# Patient Record
Sex: Female | Born: 1955 | Race: White | Hispanic: No | Marital: Married | Smoking: Never smoker
Health system: Southern US, Community
[De-identification: ages and names within clinical notes are randomized; demographics above are authoritative.]

## PROBLEM LIST (undated history)

## (undated) DIAGNOSIS — R350 Frequency of micturition: Secondary | ICD-10-CM

## (undated) DIAGNOSIS — R351 Nocturia: Secondary | ICD-10-CM

## (undated) DIAGNOSIS — R110 Nausea: Secondary | ICD-10-CM

## (undated) DIAGNOSIS — N201 Calculus of ureter: Secondary | ICD-10-CM

## (undated) DIAGNOSIS — R3915 Urgency of urination: Secondary | ICD-10-CM

## (undated) DIAGNOSIS — R51 Headache: Secondary | ICD-10-CM

## (undated) DIAGNOSIS — S82002A Unspecified fracture of left patella, initial encounter for closed fracture: Secondary | ICD-10-CM

---

## 1998-03-31 ENCOUNTER — Other Ambulatory Visit: Admission: RE | Admit: 1998-03-31 | Discharge: 1998-03-31 | Payer: Self-pay | Admitting: Obstetrics and Gynecology

## 1999-06-26 ENCOUNTER — Other Ambulatory Visit: Admission: RE | Admit: 1999-06-26 | Discharge: 1999-06-26 | Payer: Self-pay | Admitting: Obstetrics and Gynecology

## 2000-08-08 ENCOUNTER — Other Ambulatory Visit: Admission: RE | Admit: 2000-08-08 | Discharge: 2000-08-08 | Payer: Self-pay | Admitting: Obstetrics and Gynecology

## 2001-11-16 ENCOUNTER — Other Ambulatory Visit: Admission: RE | Admit: 2001-11-16 | Discharge: 2001-11-16 | Payer: Self-pay | Admitting: Obstetrics and Gynecology

## 2003-02-09 ENCOUNTER — Other Ambulatory Visit: Admission: RE | Admit: 2003-02-09 | Discharge: 2003-02-09 | Payer: Self-pay | Admitting: Obstetrics and Gynecology

## 2003-05-30 ENCOUNTER — Other Ambulatory Visit: Admission: RE | Admit: 2003-05-30 | Discharge: 2003-05-30 | Payer: Self-pay | Admitting: Obstetrics and Gynecology

## 2003-11-22 ENCOUNTER — Other Ambulatory Visit: Admission: RE | Admit: 2003-11-22 | Discharge: 2003-11-22 | Payer: Self-pay | Admitting: Obstetrics and Gynecology

## 2005-02-19 ENCOUNTER — Other Ambulatory Visit: Admission: RE | Admit: 2005-02-19 | Discharge: 2005-02-19 | Payer: Self-pay | Admitting: Obstetrics and Gynecology

## 2007-06-26 ENCOUNTER — Emergency Department (HOSPITAL_COMMUNITY): Admission: EM | Admit: 2007-06-26 | Discharge: 2007-06-26 | Payer: Self-pay | Admitting: Emergency Medicine

## 2011-01-14 LAB — DIFFERENTIAL
Eosinophils Absolute: 0
Lymphs Abs: 2.8
Neutrophils Relative %: 66

## 2011-01-14 LAB — I-STAT 8, (EC8 V) (CONVERTED LAB)
Acid-Base Excess: 1
Bicarbonate: 25.2 — ABNORMAL HIGH
Chloride: 107
HCT: 40
Operator id: 285841
pCO2, Ven: 37.7 — ABNORMAL LOW

## 2011-01-14 LAB — CBC
MCV: 92
Platelets: 299
WBC: 10

## 2011-01-14 LAB — POCT I-STAT CREATININE: Operator id: 285841

## 2011-01-14 LAB — POCT CARDIAC MARKERS
CKMB, poc: <1 — ABNORMAL LOW
Troponin i, poc: <0.05

## 2011-01-14 LAB — D-DIMER, QUANTITATIVE: D-Dimer, Quant: <0.22

## 2011-04-23 HISTORY — PX: OTHER SURGICAL HISTORY: SHX169

## 2011-07-08 ENCOUNTER — Encounter (HOSPITAL_COMMUNITY): Payer: Self-pay | Admitting: Emergency Medicine

## 2011-07-08 ENCOUNTER — Emergency Department (HOSPITAL_COMMUNITY)
Admission: EM | Admit: 2011-07-08 | Discharge: 2011-07-08 | Disposition: A | Discharge status: Home / Self Care | Payer: Worker's Compensation | Attending: Emergency Medicine | Admitting: Emergency Medicine

## 2011-07-08 DIAGNOSIS — S52539A Colles' fracture of unspecified radius, initial encounter for closed fracture: Secondary | ICD-10-CM | POA: Insufficient documentation

## 2011-07-08 DIAGNOSIS — S52509A Unspecified fracture of the lower end of unspecified radius, initial encounter for closed fracture: Secondary | ICD-10-CM

## 2011-07-08 DIAGNOSIS — M25539 Pain in unspecified wrist: Secondary | ICD-10-CM | POA: Insufficient documentation

## 2011-07-08 DIAGNOSIS — M7989 Other specified soft tissue disorders: Secondary | ICD-10-CM | POA: Insufficient documentation

## 2011-07-08 MED ORDER — HYDROCODONE-ACETAMINOPHEN 5-325 MG PO TABS: 1.0000 | ORAL_TABLET | ORAL | Status: AC | PRN

## 2011-07-08 NOTE — Discharge Instructions (Signed)
CONTINUE TO ICE AND ELEVATE THE WRIST TO REDUCE SWELLING. TAKE IBUPROFEN REGULARLY AND TAKE NORCO AS NEEDED FOR PAIN AND INFLAMMATION. FOLLOW UP WITH DR. Mina Marble FOR FURTHER TREATMENT BY CALLING HIS OFFICE IN THE MORNING TO SCHEDULE A TIME TO BE SEEN IN THE NEXT 2-3 DAYS.  Cast or Splint Care Casts and splints support injured limbs and keep bones from moving while they heal.  HOME CARE  Keep the cast or splint uncovered during the drying period.   A plaster cast can take 24 to 48 hours to dry.   A fiberglass cast will dry in less than 1 hour.   Do not rest the cast on anything harder than a pillow for 24 hours.   Do not put weight on your injured limb. Do not put pressure on the cast. Wait for your doctor's approval.   Keep the cast or splint dry.   Cover the cast or splint with a plastic bag during baths or wet weather.   If you have a cast over your chest and belly (trunk), take sponge baths until the cast is taken off.   Keep your cast or splint clean. Wash a dirty cast with a damp cloth.   Do not put any objects under your cast or splint. Do not scratch the skin under the cast with an object.   Do not take out the padding from inside your cast.   Exercise your joints near the cast as told by your doctor.   Raise (elevate) your injured limb on 1 or 2 pillows for the first 1 to 3 days.  GET HELP RIGHT AWAY IF:  Your cast or splint cracks.   Your cast or splint is too tight or too loose.   You itch badly under the cast.   Your cast gets wet or has a soft spot.   You have a bad smell coming from the cast.   You get an object stuck under the cast.   Your skin around the cast becomes red or raw.   You have new or more pain after the cast is put on.   You have fluid leaking through the cast.   You cannot move your fingers or toes.   Your fingers or toes turn colors or are cool, painful, or puffy (swollen).   You have tingling or lose feeling (numbness) around the  injured area.   You have pain or pressure under the cast.   You have trouble breathing or have shortness of breath.   You have chest pain.  MAKE SURE YOU:  Understand these instructions.   Will watch your condition.   Will get help right away if you are not doing well or get worse.  Document Released: 08/08/2010 Document Revised: 03/28/2011 Document Reviewed: 08/08/2010 [facility] Patient Information 2012 [place], [place].  Wrist Fracture Your caregiver has diagnosed you as having a fracture of the wrist. A fracture is a break in the bone or bones. A cast or splint is used to protect and keep your injured bone(s) from moving. The cast or splint will usually be on for about 5 to 6 weeks. One of the bones of the wrist (the navicular bone) often does not show up as a fracture on X-ray until later or in the healing phase. With this bone your caregiver will often cast as though it is fractured even if not seen on the X-ray. HOME CARE INSTRUCTIONS   To lessen the swelling, keep the injured part elevated while sitting  or lying down. Keeping the injury above the level of your heart (the center of the chest) will decrease swelling and pain.   Do not wear rings or jewelry on the injured hand or wrist.   Apply ice to the injury for 15 to 20 minutes, 3 to 4 times per day while awake for 2 days. Put the ice in a plastic bag and place a thin towel between the bag of ice and your cast.   If you have a plaster or fiberglass cast:   Do not try to scratch the skin under the cast using sharp or pointed objects.   Check the skin around the cast every day. You may put lotion on any red or sore areas.   Keep your cast dry and clean.   If you have a plaster splint:   Wear the splint as directed.   You may loosen the elastic around the splint if your fingers become numb, tingle, or turn cold or blue.   If you have been put in a removable splint, wear and use as directed.   Do not use powders or  deodorants in or around the cast or splint.   Do not remove padding from your cast or splint.   Do not put pressure on any part of your cast or splint. It may break. Rest your cast or splint only on a pillow the first 24 hours until it is fully hardened.   Gently move your fingers often, so they do not get stiff.   Do not remove the splint unless directed by your caregiver. Casts must be removed by an orthopedist.   Your cast or splint can be protected during bathing with a plastic bag. Do not lower the cast or splint into water.   Only take over-the-counter or prescription medicines for pain, discomfort, or fever as directed by your caregiver.   Follow up with your caregiver as directed.  SEEK IMMEDIATE MEDICAL CARE IF:   Your cast or splint gets damaged or breaks.   Your cast or splint feels too tight or loose.   You have increased pain, not controlled with medication.   You have increased swelling.   Your skin or nails below the injury turn blue or grey or feel cold or numb.   You have trouble moving or feeling your fingers.   You experience any burning or stinging from the cast or splint.   There is a bad smell coming from under the cast or splint.   New stains or fluids are coming from under the cast or splint.   You have any new injuries while wearing the cast or splint.  Document Released: 01/16/2005 Document Revised: 03/28/2011 Document Reviewed: 11/05/2006 [facility] Patient Information 2012 [place], [place].

## 2011-07-08 NOTE — ED Notes (Signed)
Pt states she was roller skating today with special needs kids and fell injuring her left wrist  Pt states she went to urgent care and had xrays, pt brought disc in with her, and it shows a fx  Pt states she has taken 4 ibuporfen prior to arrival

## 2011-07-08 NOTE — ED Provider Notes (Addendum)
History     CSN: [id number]  Arrival date & time 07/08/11  2130   First MD Initiated Contact with Patient 07/08/11 2125      No chief complaint on file.   (Consider location/radiation/quality/duration/timing/severity/associated sxs/prior treatment) Patient is a 56 y.o. female presenting with wrist pain. The history is provided by the patient.  Wrist Pain This is a new problem. The current episode started today. The problem occurs constantly. The problem has been unchanged. Pertinent negatives include no chills or fever. Associated symptoms comments: She was roller skating with a group of children and fell onto flexed left hand causing injury to wrist.. The treatment provided moderate relief.    History reviewed. No pertinent past medical history.  History reviewed. No pertinent past surgical history.  Family History  Problem Relation Age of Onset  . Cancer Other     History  Substance Use Topics  . Smoking status: Never Smoker   . Smokeless tobacco: Not on file  . Alcohol Use: No    OB History    Grav Para Term Preterm Abortions TAB SAB Ect Mult Living                  Review of Systems  Constitutional: Negative for fever and chills.  HENT: Negative.   Respiratory: Negative.   Cardiovascular: Negative.   Gastrointestinal: Negative.   Musculoskeletal:       See HPI.  Skin: Negative.   Neurological: Negative.     Allergies  Review of patient's allergies indicates no known allergies.  Home Medications   Current Outpatient Rx  Name Route Sig Dispense Refill  . DESVENLAFAXINE SUCCINATE ER 50 MG PO TB24 Oral Take 50 mg by mouth daily.    Marland Kitchen ZOLPIDEM TARTRATE 5 MG PO TABS Oral Take 5 mg by mouth at bedtime as needed.      BP 145/107  Pulse 59  Temp(Src) 98.7 F (37.1 C) (Oral)  Resp 18  SpO2 100%  Physical Exam  Constitutional: She is oriented to person, place, and time. She appears well-developed and well-nourished.  Neck: Normal range of motion.    Pulmonary/Chest: Effort normal.  Musculoskeletal:       Left wrist swollen dorsally where is a mild deformity. Pulses present and distal neurovascular exam intact. FROM all digits.   Neurological: She is alert and oriented to person, place, and time.  Skin: Skin is warm and dry.    ED Course  Procedures (including critical care time) No results found. Patient brought disc of wrist x=ray done at Urgent Care prior to arrival showing distal left radius fracture, impacted, with mild 20 degree angulation of distal fragment in volar direction. Patient was placed in a sugar-tong splint by ortho tech and instructed to see Dr. Mina Marble in the next 2-3 days. Also instructed to ice and elevate. Given Norco for pain. Labs Reviewed - No data to display No results found.   No diagnosis found. 1. Impacted distal radial fracture, left.   MDM  No neurovascular compromise. Fracture is not displaced with mild angulation. X-ray reviewed with Dr. Lynelle Doctor. Felt appropriate for splinting and hand surgeon follow up this week.         Rodena Medin, PA-C 07/08/11 2202  Rodena Medin, PA-C 07/09/11 [phone]

## 2011-07-09 NOTE — ED Provider Notes (Signed)
xrays reviewed with me  Medical screening examination/treatment/procedure(s) were performed by non-physician practitioner and as supervising physician I was immediately available for consultation/collaboration. Devoria Albe, MD, FACEP   Ward Givens, MD 07/09/11 [phone]

## 2011-07-09 NOTE — ED Provider Notes (Signed)
See prior note   Ward Givens, MD 07/09/11 [phone]

## 2012-08-27 ENCOUNTER — Other Ambulatory Visit: Payer: Self-pay | Admitting: Obstetrics and Gynecology

## 2012-08-31 ENCOUNTER — Encounter (HOSPITAL_COMMUNITY): Payer: Self-pay | Admitting: Pharmacist

## 2012-09-01 ENCOUNTER — Encounter (HOSPITAL_COMMUNITY): Payer: Self-pay

## 2012-09-01 ENCOUNTER — Encounter (HOSPITAL_COMMUNITY)
Admission: RE | Admit: 2012-09-01 | Discharge: 2012-09-01 | Disposition: A | Discharge status: Home / Self Care | Payer: BC Managed Care – PPO | Source: Ambulatory Visit | Attending: Obstetrics and Gynecology | Admitting: Obstetrics and Gynecology

## 2012-09-01 DIAGNOSIS — Z01812 Encounter for preprocedural laboratory examination: Secondary | ICD-10-CM | POA: Insufficient documentation

## 2012-09-01 DIAGNOSIS — Z01818 Encounter for other preprocedural examination: Secondary | ICD-10-CM | POA: Insufficient documentation

## 2012-09-01 LAB — COMPREHENSIVE METABOLIC PANEL
ALT: 16 U/L (ref 0–35)
Alkaline Phosphatase: 98 U/L (ref 39–117)
CO2: 31 mEq/L (ref 19–32)
GFR calc Af Amer: 89 mL/min — ABNORMAL LOW (ref >90)
GFR calc non Af Amer: 77 mL/min — ABNORMAL LOW (ref >90)
Glucose, Bld: 92 mg/dL (ref 70–99)
Potassium: 4.4 mEq/L (ref 3.5–5.1)
Sodium: 137 mEq/L (ref 135–145)

## 2012-09-01 LAB — CBC
Hemoglobin: 11.8 g/dL — ABNORMAL LOW (ref 12.0–15.0)
RBC: 3.98 MIL/uL (ref 3.87–5.11)
WBC: 8.6 10*3/uL (ref 4.0–10.5)

## 2012-09-01 NOTE — Patient Instructions (Addendum)
20 Mindy Schmitt  09/01/2012   Your procedure is scheduled on:  09/07/12  Enter through the Main Entrance of [facility] at 1030 AM.  Pick up the phone at the desk and dial [phone].   Call this number if you have problems the morning of surgery: [phone]   Remember:   Do not eat food:After Midnight.  Do not drink clear liquids: 4 Hours before arrival.  Take these medicines the morning of surgery with A SIP OF WATER: NA   Do not wear jewelry, make-up or nail polish.  Do not wear lotions, powders, or perfumes. You may wear deodorant.  Do not shave 48 hours prior to surgery.  Do not bring valuables to the hospital.  Contacts, dentures or bridgework may not be worn into surgery.  Leave suitcase in the car. After surgery it may be brought to your room.  For patients admitted to the hospital, checkout time is 11:00 AM the day of discharge.   Patients discharged the day of surgery will not be allowed to drive home.  Name and phone number of your driver: NA  Special Instructions: Shower using CHG 2 nights before surgery and the night before surgery.  If you shower the day of surgery use CHG.  Use special wash - you have one bottle of CHG for all showers.  You should use approximately 1/3 of the bottle for each shower.   Please read over the following fact sheets that you were given: MRSA Information

## 2012-09-07 ENCOUNTER — Observation Stay (HOSPITAL_COMMUNITY)
Admission: RE | Admit: 2012-09-07 | Discharge: 2012-09-09 | Disposition: A | Discharge status: Home / Self Care | Payer: BC Managed Care – PPO | Source: Ambulatory Visit | Attending: Obstetrics and Gynecology | Admitting: Obstetrics and Gynecology

## 2012-09-07 ENCOUNTER — Encounter (HOSPITAL_COMMUNITY): Payer: Self-pay | Admitting: *Deleted

## 2012-09-07 ENCOUNTER — Encounter (HOSPITAL_COMMUNITY): Payer: Self-pay | Admitting: Anesthesiology

## 2012-09-07 ENCOUNTER — Encounter (HOSPITAL_COMMUNITY)
Admission: RE | Disposition: A | Discharge status: Home / Self Care | Payer: Self-pay | Source: Ambulatory Visit | Attending: Obstetrics and Gynecology

## 2012-09-07 ENCOUNTER — Ambulatory Visit (HOSPITAL_COMMUNITY): Payer: BC Managed Care – PPO | Admitting: Anesthesiology

## 2012-09-07 DIAGNOSIS — N8000 Endometriosis of the uterus, unspecified: Secondary | ICD-10-CM | POA: Insufficient documentation

## 2012-09-07 DIAGNOSIS — N812 Incomplete uterovaginal prolapse: Principal | ICD-10-CM | POA: Insufficient documentation

## 2012-09-07 DIAGNOSIS — N393 Stress incontinence (female) (male): Secondary | ICD-10-CM | POA: Insufficient documentation

## 2012-09-07 HISTORY — PX: BLADDER SUSPENSION: SHX72

## 2012-09-07 HISTORY — PX: ANTERIOR AND POSTERIOR REPAIR: SHX5121

## 2012-09-07 HISTORY — PX: VAGINAL HYSTERECTOMY: SHX2639

## 2012-09-07 SURGERY — HYSTERECTOMY, VAGINAL
Anesthesia: General | Site: Vagina | Wound class: Clean Contaminated

## 2012-09-07 MED ORDER — LIDOCAINE-EPINEPHRINE 1 %-1:100000 IJ SOLN
INTRAMUSCULAR | Status: DC | PRN
  Administered 2012-09-07: 60 mL

## 2012-09-07 MED ORDER — MIDAZOLAM HCL 2 MG/2ML IJ SOLN
INTRAMUSCULAR | Status: AC
  Filled 2012-09-07: qty 2

## 2012-09-07 MED ORDER — ONDANSETRON HCL 4 MG PO TABS: 4.0000 mg | ORAL_TABLET | Freq: Four times a day (QID) | ORAL | Status: DC | PRN

## 2012-09-07 MED ORDER — ONDANSETRON HCL 4 MG/2ML IJ SOLN
4.0000 mg | Freq: Four times a day (QID) | INTRAMUSCULAR | Status: DC | PRN
  Administered 2012-09-07: 4 mg via INTRAVENOUS

## 2012-09-07 MED ORDER — DEXTROSE IN LACTATED RINGERS 5 % IV SOLN
INTRAVENOUS | Status: DC
  Administered 2012-09-07 – 2012-09-09 (×3): via INTRAVENOUS

## 2012-09-07 MED ORDER — DIPHENHYDRAMINE HCL 12.5 MG/5ML PO ELIX: 12.5000 mg | ORAL_SOLUTION | Freq: Four times a day (QID) | ORAL | Status: DC | PRN

## 2012-09-07 MED ORDER — EPHEDRINE SULFATE 50 MG/ML IJ SOLN
INTRAMUSCULAR | Status: DC | PRN
  Administered 2012-09-07 (×2): 10 mg via INTRAVENOUS

## 2012-09-07 MED ORDER — HYDROMORPHONE 0.3 MG/ML IV SOLN
INTRAVENOUS | Status: DC
  Administered 2012-09-07: 4 mL via INTRAVENOUS
  Administered 2012-09-07: 3.3 mg via INTRAVENOUS
  Administered 2012-09-08 (×2): 1.2 mg via INTRAVENOUS
  Administered 2012-09-08: 07:00:00 via INTRAVENOUS
  Filled 2012-09-07 (×2): qty 25

## 2012-09-07 MED ORDER — NALOXONE HCL 0.4 MG/ML IJ SOLN: 0.4000 mg | INTRAMUSCULAR | Status: DC | PRN

## 2012-09-07 MED ORDER — SODIUM CHLORIDE 0.9 % IJ SOLN: 9.0000 mL | INTRAMUSCULAR | Status: DC | PRN

## 2012-09-07 MED ORDER — SIMETHICONE 80 MG PO CHEW: 80.0000 mg | CHEWABLE_TABLET | Freq: Four times a day (QID) | ORAL | Status: DC | PRN

## 2012-09-07 MED ORDER — DEXAMETHASONE SODIUM PHOSPHATE 10 MG/ML IJ SOLN
INTRAMUSCULAR | Status: AC
  Filled 2012-09-07: qty 1

## 2012-09-07 MED ORDER — FENTANYL CITRATE 0.05 MG/ML IJ SOLN
INTRAMUSCULAR | Status: AC
  Filled 2012-09-07: qty 2

## 2012-09-07 MED ORDER — ROCURONIUM BROMIDE 50 MG/5ML IV SOLN
INTRAVENOUS | Status: AC
  Filled 2012-09-07: qty 1

## 2012-09-07 MED ORDER — OXYCODONE-ACETAMINOPHEN 5-325 MG PO TABS
1.0000 | ORAL_TABLET | ORAL | Status: DC | PRN
  Administered 2012-09-08: 2 via ORAL
  Administered 2012-09-08: 1 via ORAL
  Administered 2012-09-08: 2 via ORAL
  Filled 2012-09-07 (×2): qty 2
  Filled 2012-09-07: qty 1

## 2012-09-07 MED ORDER — INDIGOTINDISULFONATE SODIUM 8 MG/ML IJ SOLN
INTRAMUSCULAR | Status: DC | PRN
  Administered 2012-09-07: 40 mg via INTRAVENOUS

## 2012-09-07 MED ORDER — LACTATED RINGERS IV SOLN
INTRAVENOUS | Status: DC
  Administered 2012-09-07 (×2): via INTRAVENOUS

## 2012-09-07 MED ORDER — GLYCOPYRROLATE 0.2 MG/ML IJ SOLN
INTRAMUSCULAR | Status: DC | PRN
  Administered 2012-09-07: 0.6 mg via INTRAVENOUS

## 2012-09-07 MED ORDER — LIDOCAINE-EPINEPHRINE 1 %-1:100000 IJ SOLN
INTRAMUSCULAR | Status: DC | PRN
  Administered 2012-09-07: 20 mL

## 2012-09-07 MED ORDER — NEOSTIGMINE METHYLSULFATE 1 MG/ML IJ SOLN
INTRAMUSCULAR | Status: AC
  Filled 2012-09-07: qty 1

## 2012-09-07 MED ORDER — FENTANYL CITRATE 0.05 MG/ML IJ SOLN
INTRAMUSCULAR | Status: AC
  Filled 2012-09-07: qty 5

## 2012-09-07 MED ORDER — HYDROMORPHONE HCL PF 1 MG/ML IJ SOLN: 0.2500 mg | INTRAMUSCULAR | Status: DC | PRN

## 2012-09-07 MED ORDER — GLYCOPYRROLATE 0.2 MG/ML IJ SOLN
INTRAMUSCULAR | Status: AC
  Filled 2012-09-07: qty 3

## 2012-09-07 MED ORDER — STERILE WATER FOR IRRIGATION IR SOLN
Status: DC | PRN
  Administered 2012-09-07: 1000 mL

## 2012-09-07 MED ORDER — LIDOCAINE HCL (CARDIAC) 20 MG/ML IV SOLN
INTRAVENOUS | Status: AC
  Filled 2012-09-07: qty 5

## 2012-09-07 MED ORDER — ONDANSETRON HCL 4 MG/2ML IJ SOLN
4.0000 mg | Freq: Four times a day (QID) | INTRAMUSCULAR | Status: DC | PRN
  Administered 2012-09-08 (×2): 4 mg via INTRAVENOUS
  Filled 2012-09-07 (×3): qty 2

## 2012-09-07 MED ORDER — CEFAZOLIN SODIUM-DEXTROSE 2-3 GM-% IV SOLR
INTRAVENOUS | Status: AC
  Filled 2012-09-07: qty 50

## 2012-09-07 MED ORDER — DEXAMETHASONE SODIUM PHOSPHATE 4 MG/ML IJ SOLN
INTRAMUSCULAR | Status: DC | PRN
  Administered 2012-09-07: 10 mg via INTRAVENOUS

## 2012-09-07 MED ORDER — ACETAMINOPHEN 10 MG/ML IV SOLN
INTRAVENOUS | Status: AC
  Filled 2012-09-07: qty 100

## 2012-09-07 MED ORDER — ONDANSETRON HCL 4 MG/2ML IJ SOLN
INTRAMUSCULAR | Status: DC | PRN
  Administered 2012-09-07: 4 mg via INTRAVENOUS

## 2012-09-07 MED ORDER — DOCUSATE SODIUM 100 MG PO CAPS
100.0000 mg | ORAL_CAPSULE | Freq: Two times a day (BID) | ORAL | Status: DC
  Administered 2012-09-07 – 2012-09-09 (×4): 100 mg via ORAL
  Filled 2012-09-07 (×4): qty 1

## 2012-09-07 MED ORDER — PROPOFOL 10 MG/ML IV BOLUS
INTRAVENOUS | Status: DC | PRN
  Administered 2012-09-07: 20 ug via INTRAVENOUS
  Administered 2012-09-07: 160 ug via INTRAVENOUS

## 2012-09-07 MED ORDER — CEFAZOLIN SODIUM-DEXTROSE 2-3 GM-% IV SOLR
2.0000 g | INTRAVENOUS | Status: AC
  Administered 2012-09-07: 2 g via INTRAVENOUS

## 2012-09-07 MED ORDER — FENTANYL CITRATE 0.05 MG/ML IJ SOLN
INTRAMUSCULAR | Status: DC | PRN
  Administered 2012-09-07: 100 ug via INTRAVENOUS
  Administered 2012-09-07 (×2): 50 ug via INTRAVENOUS
  Administered 2012-09-07: 100 ug via INTRAVENOUS
  Administered 2012-09-07: 50 ug via INTRAVENOUS

## 2012-09-07 MED ORDER — SODIUM CHLORIDE 0.9 % IJ SOLN
INTRAMUSCULAR | Status: DC | PRN
  Administered 2012-09-07: 50 mL via INTRAVENOUS

## 2012-09-07 MED ORDER — ROCURONIUM BROMIDE 100 MG/10ML IV SOLN
INTRAVENOUS | Status: DC | PRN
  Administered 2012-09-07: 35 mg via INTRAVENOUS

## 2012-09-07 MED ORDER — HYDROMORPHONE HCL PF 1 MG/ML IJ SOLN
INTRAMUSCULAR | Status: AC
  Administered 2012-09-07: 0.5 mg via INTRAVENOUS
  Filled 2012-09-07: qty 1

## 2012-09-07 MED ORDER — NEOSTIGMINE METHYLSULFATE 1 MG/ML IJ SOLN
INTRAMUSCULAR | Status: DC | PRN
  Administered 2012-09-07: 3 mg via INTRAVENOUS

## 2012-09-07 MED ORDER — LIDOCAINE HCL (CARDIAC) 20 MG/ML IV SOLN
INTRAVENOUS | Status: DC | PRN
  Administered 2012-09-07: 30 mg via INTRAVENOUS

## 2012-09-07 MED ORDER — MIDAZOLAM HCL 5 MG/5ML IJ SOLN
INTRAMUSCULAR | Status: DC | PRN
  Administered 2012-09-07 (×2): 1 mg via INTRAVENOUS

## 2012-09-07 MED ORDER — INDIGOTINDISULFONATE SODIUM 8 MG/ML IJ SOLN
INTRAMUSCULAR | Status: AC
  Filled 2012-09-07: qty 5

## 2012-09-07 MED ORDER — DIPHENHYDRAMINE HCL 50 MG/ML IJ SOLN: 12.5000 mg | Freq: Four times a day (QID) | INTRAMUSCULAR | Status: DC | PRN

## 2012-09-07 MED ORDER — EPHEDRINE 5 MG/ML INJ
INTRAVENOUS | Status: AC
  Filled 2012-09-07: qty 10

## 2012-09-07 MED ORDER — PROPOFOL 10 MG/ML IV EMUL
INTRAVENOUS | Status: AC
  Filled 2012-09-07: qty 20

## 2012-09-07 MED ORDER — ONDANSETRON HCL 4 MG/2ML IJ SOLN
INTRAMUSCULAR | Status: AC
  Filled 2012-09-07: qty 2

## 2012-09-07 MED ORDER — ACETAMINOPHEN 10 MG/ML IV SOLN
INTRAVENOUS | Status: DC | PRN
  Administered 2012-09-07: 1000 mg via INTRAVENOUS

## 2012-09-07 MED ORDER — ESTRADIOL 0.1 MG/GM VA CREA
TOPICAL_CREAM | VAGINAL | Status: AC
  Filled 2012-09-07: qty 42.5

## 2012-09-07 SURGICAL SUPPLY — 46 items
ADH SKN CLS APL DERMABOND .7 (GAUZE/BANDAGES/DRESSINGS) ×2
CANISTER SUCTION 2500CC (MISCELLANEOUS) ×3 IMPLANT
CATH BONANNO SUPRAPUBIC 14G (CATHETERS) IMPLANT
CATH FOLEY 2WAY SLVR  5CC 18FR (CATHETERS) ×1
CATH FOLEY 2WAY SLVR 5CC 18FR (CATHETERS) ×2 IMPLANT
CATH ROBINSON RED A/P 16FR (CATHETERS) ×2 IMPLANT
CLOTH BEACON ORANGE TIMEOUT ST (SAFETY) ×3 IMPLANT
CONT PATH 16OZ SNAP LID 3702 (MISCELLANEOUS) ×2 IMPLANT
CONTAINER PREFILL 10% NBF 60ML (FORM) ×3 IMPLANT
DECANTER SPIKE VIAL GLASS SM (MISCELLANEOUS) ×2 IMPLANT
DERMABOND ADVANCED (GAUZE/BANDAGES/DRESSINGS) ×1
DERMABOND ADVANCED .7 DNX12 (GAUZE/BANDAGES/DRESSINGS) ×2 IMPLANT
DRAPE HYSTEROSCOPY (DRAPE) ×3 IMPLANT
DRESSING TELFA 8X3 (GAUZE/BANDAGES/DRESSINGS) ×3 IMPLANT
GAUZE PACKING 2X5 YD STERILE (GAUZE/BANDAGES/DRESSINGS) IMPLANT
GAUZE PACKING IODOFORM 2 (PACKING) IMPLANT
GLOVE ECLIPSE 7.0 STRL STRAW (GLOVE) ×6 IMPLANT
GOWN PREVENTION PLUS LG XLONG (DISPOSABLE) ×6 IMPLANT
GOWN PREVENTION PLUS XLARGE (GOWN DISPOSABLE) ×3 IMPLANT
GOWN STRL REIN XL XLG (GOWN DISPOSABLE) ×12 IMPLANT
NDL SPNL 18GX3.5 QUINCKE PK (NEEDLE) ×1 IMPLANT
NDL SPNL 22GX3.5 QUINCKE BK (NEEDLE) IMPLANT
NEEDLE HYPO 22GX1.5 SAFETY (NEEDLE) ×2 IMPLANT
NEEDLE SPNL 18GX3.5 QUINCKE PK (NEEDLE) ×3 IMPLANT
NEEDLE SPNL 22GX3.5 QUINCKE BK (NEEDLE) ×3 IMPLANT
NS IRRIG 1000ML POUR BTL (IV SOLUTION) ×3 IMPLANT
PACK VAGINAL WOMENS (CUSTOM PROCEDURE TRAY) ×3 IMPLANT
PAD OB MATERNITY 4.3X12.25 (PERSONAL CARE ITEMS) ×3 IMPLANT
SET CYSTO W/LG BORE CLAMP LF (SET/KITS/TRAYS/PACK) ×3 IMPLANT
SPONGE LAP 4X18 X RAY DECT (DISPOSABLE) ×3 IMPLANT
SUT MNCRL 0 MO-4 VIOLET 18 CR (SUTURE) ×6 IMPLANT
SUT MNCRL 0 VIOLET 6X18 (SUTURE) ×2 IMPLANT
SUT MONOCRYL 0 6X18 (SUTURE) ×1
SUT MONOCRYL 0 MO 4 18  CR/8 (SUTURE) ×3
SUT VIC AB 0 CT1 27 (SUTURE) ×6
SUT VIC AB 0 CT1 27XBRD ANBCTR (SUTURE) ×4 IMPLANT
SUT VIC AB 2-0 CT1 27 (SUTURE) ×21
SUT VIC AB 2-0 CT1 TAPERPNT 27 (SUTURE) ×10 IMPLANT
SUT VIC AB 2-0 UR5 27 (SUTURE) IMPLANT
SUT VIC AB 2-0 UR6 27 (SUTURE) IMPLANT
SUT VICRYL 0 UR6 27IN ABS (SUTURE) IMPLANT
SUT VICRYL RAPIDE 3 0 (SUTURE) IMPLANT
SYR 20CC LL (SYRINGE) ×3 IMPLANT
TOWEL OR 17X24 6PK STRL BLUE (TOWEL DISPOSABLE) ×6 IMPLANT
TRAY FOLEY CATH 14FR (SET/KITS/TRAYS/PACK) ×3 IMPLANT
WATER STERILE IRR 1000ML POUR (IV SOLUTION) ×3 IMPLANT

## 2012-09-07 NOTE — Anesthesia Preprocedure Evaluation (Signed)

## 2012-09-07 NOTE — Transfer of Care (Signed)
Immediate Anesthesia Transfer of Care Note  Patient: Mindy Schmitt  Procedure(s) Performed: Procedure(s): HYSTERECTOMY VAGINAL (N/A) SALPINGO OOPHORECTOMY (Bilateral) ANTERIOR (CYSTOCELE) AND POSTERIOR REPAIR (RECTOCELE) (N/A) TRANSVAGINAL TAPE (TVT) PROCEDURE with CYSTO (N/A)  Patient Location: PACU  Anesthesia Type:General  Level of Consciousness: awake, sedated, pateint uncooperative and responds to stimulation  Airway & Oxygen Therapy: Patient Spontanous Breathing and Patient connected to nasal cannula oxygen  Post-op Assessment: Report given to PACU RN and Post -op Vital signs reviewed and stable  Post vital signs: Reviewed and stable  Complications: No apparent anesthesia complications

## 2012-09-07 NOTE — H&P (Signed)
Pt is a 57 yr old white female who presents to the OR for a TVH poss BSO, TVT, A&P repairs for a several yr h.o. Worsening pelvic prolapse, SUI. Pt c/o worsening pressure and heaviness. Cannot work a full day because of this. PE: VSSAF         HEENT- wnl         CV- rrr without murmur.         ABD- soft, non tender, no masses         Pelvic- ext- atrophic                     Vagina- with cystocele and recotcele.                     cx - parous, no lesion                     Ut- 1st degree prolapse, small                     Adnexa- no masses. IMP/ Symptomatic prolapse          SUI PLAN/ TVH poss BSO, TVT, cysto

## 2012-09-07 NOTE — OR Nursing (Signed)
Pt fell  Last week. Noted small open abrasion on lower rt leg, small abrasion below left knee. Small abrasion on rt hand

## 2012-09-07 NOTE — Anesthesia Postprocedure Evaluation (Signed)
  Anesthesia Post-op Note  Patient: Mindy Schmitt  Procedure(s) Performed: Procedure(s): HYSTERECTOMY VAGINAL (N/A) ANTERIOR (CYSTOCELE) AND POSTERIOR REPAIR (RECTOCELE) (N/A) TRANSVAGINAL TAPE (TVT) PROCEDURE with CYSTO (N/A)  Patient Location: PACU  Anesthesia Type:General  Level of Consciousness: awake, alert  and oriented  Airway and Oxygen Therapy: Patient Spontanous Breathing  Post-op Pain: mild  Post-op Assessment: Post-op Vital signs reviewed, Patient's Cardiovascular Status Stable, Respiratory Function Stable, Patent Airway, No signs of Nausea or vomiting and Pain level controlled  Post-op Vital Signs: Reviewed and stable  Complications: No apparent anesthesia complications

## 2012-09-07 NOTE — Anesthesia Postprocedure Evaluation (Signed)
  Anesthesia Post-op Note  Patient: Mindy Schmitt  Procedure(s) Performed: Procedure(s): HYSTERECTOMY VAGINAL (N/A) ANTERIOR (CYSTOCELE) AND POSTERIOR REPAIR (RECTOCELE) (N/A) TRANSVAGINAL TAPE (TVT) PROCEDURE with CYSTO (N/A)  Patient Location: [facility]  Anesthesia Type:General  Level of Consciousness: awake, alert  and oriented  Airway and Oxygen Therapy: Patient Spontanous Breathing and Patient connected to nasal cannula oxygen  Post-op Pain: none  Post-op Assessment: Post-op Vital signs reviewed and Patient's Cardiovascular Status Stable  Post-op Vital Signs: Reviewed and stable  Complications: No apparent anesthesia complications

## 2012-09-08 ENCOUNTER — Encounter (HOSPITAL_COMMUNITY): Payer: Self-pay | Admitting: Obstetrics and Gynecology

## 2012-09-08 LAB — CBC
MCH: 29.5 pg (ref 26.0–34.0)
Platelets: 262 10*3/uL (ref 150–400)
RBC: 3.56 MIL/uL — ABNORMAL LOW (ref 3.87–5.11)
RDW: 13.2 % (ref 11.5–15.5)

## 2012-09-08 MED ORDER — KETOROLAC TROMETHAMINE 30 MG/ML IJ SOLN
30.0000 mg | Freq: Four times a day (QID) | INTRAMUSCULAR | Status: DC
  Administered 2012-09-08 – 2012-09-09 (×2): 30 mg via INTRAVENOUS
  Filled 2012-09-08 (×2): qty 1

## 2012-09-08 MED ORDER — ZOLPIDEM TARTRATE 5 MG PO TABS
5.0000 mg | ORAL_TABLET | Freq: Every evening | ORAL | Status: DC | PRN
  Administered 2012-09-08: 5 mg via ORAL
  Filled 2012-09-08: qty 1

## 2012-09-08 MED ORDER — DIPHENHYDRAMINE HCL 25 MG PO CAPS
25.0000 mg | ORAL_CAPSULE | ORAL | Status: DC | PRN
  Administered 2012-09-08: 25 mg via ORAL
  Filled 2012-09-08: qty 1

## 2012-09-08 MED ORDER — DEXTROSE 5 % IN LACTATED RINGERS IV BOLUS
500.0000 mL | Freq: Once | INTRAVENOUS | Status: AC
  Administered 2012-09-08: 500 mL via INTRAVENOUS

## 2012-09-08 MED ORDER — HYDROMORPHONE HCL 2 MG PO TABS
2.0000 mg | ORAL_TABLET | ORAL | Status: DC | PRN
  Administered 2012-09-09 (×3): 2 mg via ORAL
  Administered 2012-09-09: 4 mg via ORAL
  Filled 2012-09-08 (×2): qty 1
  Filled 2012-09-08: qty 2
  Filled 2012-09-08: qty 1

## 2012-09-08 NOTE — Progress Notes (Signed)
POD#1 Pt had some nausea this am. No emesis. Ambulating well. Slept poorly. VSSAF HGB- stable ABD- soft, incisions healing well. IMP/ stable PLAN/ Will remove the packing and foley.            Voiding trial.            Increase diet.            Shower.

## 2012-09-08 NOTE — Op Note (Signed)
Mindy Schmitt, Mindy Schmitt              ACCOUNT NO.:  [id number]  MEDICAL RECORD NO.:  [id number]  LOCATION:  9305                          FACILITY:  [facility]  PHYSICIAN:  Malva Limes, M.D.    DATE OF BIRTH:  [birth year]  DATE OF PROCEDURE:  09/07/2012 DATE OF DISCHARGE:                              OPERATIVE REPORT   PREOPERATIVE DIAGNOSIS: 1. Symptomatic uterine prolapse. 2. Cystocele. 3. Rectocele. 4. Stress urine incontinence.  PRINCIPLE PROCEDURES: 1. Total vaginal hysterectomy. 2. Anterior colporrhaphy. 3. Posterior colporrhaphy. 4. TVT urethropexy. 5. Cystoscopy.  SURGEON:  Malva Limes, M.D.  ASSISTANT:  Luvenia Redden, M.D.  ANESTHESIA:  Local and general.  ANTIBIOTICS:  Ancef 2 g.  DRAINS:  Foley at bedside drainage.  ESTIMATED BLOOD LOSS:  100 mL.  COMPLICATIONS:  None.  SPECIMENS:  Cervix and uterus sent to pathology.  PROCEDURE IN DETAIL:  The patient was taken to the operating room, where general anesthetic was administered without difficulty.  She was then placed in dorsal lithotomy position.  She was prepped and draped in the usual fashion for this procedure.  Her bladder was drained with a red rubber catheter.  At this point, a weighted speculum was placed and 10 mL of 1% lidocaine used for paracervical block.  The posterior cul-de- sac was entered sharply.  The uterosacral ligaments were bilaterally clamped, cut, and ligated with 0 Monocryl suture.  The cervix was then circumscribed.  The anterior cul-de-sac was entered sharply.  The cardinal ligaments were serially clamped, cut, and ligated with 0 Monocryl suture.  The uterine vessels were bilaterally clamped, cut, and ligated with 0 Monocryl suture.  Once the level of the fallopian tube was reached; the fallopian tube, ovarian ligament, and round ligament were bilaterally clamped, cut, and ligated x2 with 0 Monocryl suture. The specimen was then removed.  Hemostasis was checked and felt to  be adequate in all pedicles.  Attempts were made to perform a bilateral salpingo-oophorectomy, however, this was technically impossible.  The ovaries were visualized and were approximately 1 cm in size, however, far out of the operative field.  At this point, the posterior cuff was run using 2-0 Vicryl in a running locking fashion.  The Foley catheter was placed and the bladder drained.  Uterosacral ligaments were reapproximated in the midline using a Heaney stitch.  The anterior vagina was then injected with 1% lidocaine, and transected in the midline.  The cystocele was reduced with sharp and blunt dissection. The cystocele was repaired using interrupted 2-0 Vicryl sutures in a figure-of-eight fashion.  Once that was accomplished, the excess vaginal tissue was removed and the vagina closed using 2-0 Vicryl in a running, locking fashion.  The entire cuff was closed vertically using 2-0 Vicryl in a running fashion.  At this point, the posterior cul-de-sac was injected with 1% lidocaine.  The peritoneum was opened with a knife in a wedge fashion.  The vagina was dissected from the rectocele and then repaired using 2-0 Vicryl in a running fashion.  Excess vaginal tissue was removed.  The perineal body was built up with interrupted Monocryl suture.  The vaginal cuff.  The posterior vaginal cuff was then run using 2-0  Vicryl in a running locking fashion.  The perineum was repaired in a manner similar to an episiotomy.  At this point, the urethropexy was begun.  The patient had this retropubic space injected with a dilute solution of 1% lidocaine.  The anterior vaginal wall was then injected with 1% lidocaine, a 1 cm incision was made in the anterior vagina and 1 cm distal from the urethral meatus.  A urinary catheter with the stylus placed through it was placed into the bladder. The bladder was deviated to the patient's right and the TVT needle was placed through the anterior vaginal wall  onto the patient's left to the retropubic space.  A similar procedure was performed on the opposite side.  Once this was accomplished, a cystoscopy was performed.  During this cystoscopy there was no evidence of any foreign material.  Folds were seen in the dome.  The urethra had no abnormalities of foreign bodies in it.  The ureters were seen to have blue dye coming from both orifices.  Once this was accomplished, the sling was pulled into place. The sheath on the sling was removed.  A Kelly forceps was placed between the sling and the urethra and tightened.  The excess material was removed.  The incisions on the suprapubic region were closed with Dermabond.  The vaginal incision was closed with 2-0 Vicryl in a running locking fashion.  The patient was woken and taken to recovery room in stable condition.  Instrument, lap count was correct x3.          ______________________________ Malva Limes, M.D.     MA/MEDQ  D:  09/07/2012  T:  09/08/2012  Job:  [id number]

## 2012-09-09 MED ORDER — ADULT MULTIVITAMIN W/MINERALS CH: 1.0000 | ORAL_TABLET | Freq: Every day | ORAL | Status: AC

## 2012-09-09 NOTE — Progress Notes (Signed)
POD#2 Pt states that she feels much better this am. Nausea resolved after change in pain meds.  VSSAF PLAN/ Will start voiding trial. Plan for discharge today.

## 2012-09-09 NOTE — Progress Notes (Signed)
Discharge instructions reviewed with patient and husband.  Both state understanding of home care, medications, activity, signs/symptoms to report to MD and follow up MD office visit.  Patient instructed on foley cath. Care.  Patient and husband instructed how to removed foley with syring and how to cut pigtail of cath. To deflate balloon.  Will return to MD office on Friday after removing foley cath. @ home.  No home equipment needed.  Patient in wheelchair with staff without incident in stable condition and discharged to home.

## 2012-09-09 NOTE — Progress Notes (Signed)
Pt did not pass the voiding trial. Will discharge to home with foley and return to office on Friday for another trial.

## 2012-09-09 NOTE — Progress Notes (Signed)
Ur chart review completed.  

## 2012-09-15 ENCOUNTER — Inpatient Hospital Stay (HOSPITAL_COMMUNITY)
Admission: AD | Admit: 2012-09-15 | Discharge: 2012-09-15 | Disposition: A | Discharge status: Home / Self Care | Payer: BC Managed Care – PPO | Source: Ambulatory Visit | Attending: Obstetrics and Gynecology | Admitting: Obstetrics and Gynecology

## 2012-09-15 DIAGNOSIS — G8918 Other acute postprocedural pain: Secondary | ICD-10-CM | POA: Insufficient documentation

## 2012-09-15 DIAGNOSIS — IMO0002 Reserved for concepts with insufficient information to code with codable children: Secondary | ICD-10-CM | POA: Insufficient documentation

## 2012-09-15 DIAGNOSIS — R339 Retention of urine, unspecified: Secondary | ICD-10-CM | POA: Insufficient documentation

## 2012-09-15 DIAGNOSIS — N9989 Other postprocedural complications and disorders of genitourinary system: Secondary | ICD-10-CM

## 2012-09-15 DIAGNOSIS — R109 Unspecified abdominal pain: Secondary | ICD-10-CM

## 2012-09-15 LAB — URINALYSIS, ROUTINE W REFLEX MICROSCOPIC
Ketones, ur: NEGATIVE mg/dL
Leukocytes, UA: NEGATIVE
Nitrite: NEGATIVE
Protein, ur: NEGATIVE mg/dL
Urobilinogen, UA: 0.2 mg/dL (ref 0.0–1.0)
pH: 6.5 (ref 5.0–8.0)

## 2012-09-15 LAB — CBC
Hemoglobin: 11 g/dL — ABNORMAL LOW (ref 12.0–15.0)
MCHC: 32.3 g/dL (ref 30.0–36.0)
Platelets: 322 10*3/uL (ref 150–400)
RDW: 13.3 % (ref 11.5–15.5)

## 2012-09-15 LAB — URINE MICROSCOPIC-ADD ON

## 2012-09-15 NOTE — MAU Provider Note (Signed)
Chief Complaint: Post-op Problem  First Provider Initiated Contact with Patient 09/15/12 0221     SUBJECTIVE HPI: Mindy Schmitt is a 57 y.o. female 8 days postop multiple GYN surgical procedures (see below) who presents with severe low abdominal pain and urinary retention. Sent home with catheter. Seen in the office 09/11/2012. Catheter removed and patient started on blue pills to "help her urinate ". Patient able to void over the next 2 days. Minimal postop pain. Urine output began to decrease on 09/14/2012. Unable to void since 1400.   PRINCIPLE PROCEDURES:  1. Total vaginal hysterectomy.  2. Anterior colporrhaphy.  3. Posterior colporrhaphy.  4. TVT urethropexy.  5. Cystoscopy.  Past Medical History  Diagnosis Date  . Medical history non-contributory    OB History   Grav Para Term Preterm Abortions TAB SAB Ect Mult Living                 Past Surgical History  Procedure Laterality Date  . Wrist fracture surgery Left   . Vaginal hysterectomy N/A 09/07/2012    Procedure: HYSTERECTOMY VAGINAL;  Surgeon: Levi Aland, MD;  Location: [facility];  Service: Gynecology;  Laterality: N/A;  . Anterior and posterior repair N/A 09/07/2012    Procedure: ANTERIOR (CYSTOCELE) AND POSTERIOR REPAIR (RECTOCELE);  Surgeon: Levi Aland, MD;  Location: [facility];  Service: Gynecology;  Laterality: N/A;  . Bladder suspension N/A 09/07/2012    Procedure: TRANSVAGINAL TAPE (TVT) PROCEDURE with CYSTO;  Surgeon: Levi Aland, MD;  Location: [facility];  Service: Gynecology;  Laterality: N/A;   History   Social History  . Marital Status: Married    Spouse Name: N/A    Number of Children: N/A  . Years of Education: N/A   Occupational History  . Not on file.   Social History Main Topics  . Smoking status: Never Smoker   . Smokeless tobacco: Not on file  . Alcohol Use: No  . Drug Use: No  . Sexually Active: Not on file   Other Topics Concern  . Not on file   Social History Narrative  . No  narrative on file   No current facility-administered medications on file prior to encounter.   Current Outpatient Prescriptions on File Prior to Encounter  Medication Sig Dispense Refill  . Multiple Vitamin (MULTIVITAMIN WITH MINERALS) TABS Take 1 tablet by mouth daily.  30 tablet  0   No Known Allergies  ROS: Positive for low abdominal pain, urinary retention. Negative for fever, chills, dysuria, flank pain, hematuria, vaginal bleeding nausea, vomiting, diarrhea, constipation.  OBJECTIVE Blood pressure 124/74, pulse 86, temperature 97.8 F (36.6 C), temperature source Oral, resp. rate 18, SpO2 100.00%.  GENERAL: Well-developed, well-nourished female in mild distress.  HEENT: Normocephalic HEART: normal rate RESP: normal effort ABDOMEN: Soft, mild low abdominal tenderness EXTREMITIES: Nontender, no edema NEURO: Alert and oriented SPECULUM EXAM: Deferred  LAB RESULTS Results for orders placed during the hospital encounter of 09/15/12 (from the past 24 hour(s))  CBC     Status: Abnormal   Collection Time    09/15/12  2:25 AM      Result Value Range   WBC 10.8 (*) 4.0 - 10.5 K/uL   RBC 3.75 (*) 3.87 - 5.11 MIL/uL   Hemoglobin 11.0 (*) 12.0 - 15.0 g/dL   HCT 16.1 (*) 09.6 - 04.5 %   MCV 90.9  78.0 - 100.0 fL   MCH 29.3  26.0 - 34.0 pg   MCHC 32.3  30.0 -  36.0 g/dL   RDW 14.7  82.9 - 56.2 %   Platelets 322  150 - 400 K/uL  URINALYSIS, ROUTINE W REFLEX MICROSCOPIC     Status: Abnormal   Collection Time    09/15/12  3:07 AM      Result Value Range   Color, Urine YELLOW  YELLOW   APPearance CLEAR  CLEAR   Specific Gravity, Urine <1.005 (*) 1.005 - 1.030   pH 6.5  5.0 - 8.0   Glucose, UA NEGATIVE  NEGATIVE mg/dL   Hgb urine dipstick TRACE (*) NEGATIVE   Bilirubin Urine NEGATIVE  NEGATIVE   Ketones, ur NEGATIVE  NEGATIVE mg/dL   Protein, ur NEGATIVE  NEGATIVE mg/dL   Urobilinogen, UA 0.2  0.0 - 1.0 mg/dL   Nitrite NEGATIVE  NEGATIVE   Leukocytes, UA NEGATIVE  NEGATIVE   URINE MICROSCOPIC-ADD ON     Status: Abnormal   Collection Time    09/15/12  3:07 AM      Result Value Range   Squamous Epithelial / LPF FEW (*) RARE   WBC, UA 0-2  <3 WBC/hpf   RBC / HPF 3-6  <3 RBC/hpf   Bacteria, UA FEW (*) RARE    IMAGING No results found.  MAU COURSE Unable to void. Cathed 1000 ml. Pain resolved. Dr. Claiborne Billings notified of urinary retention, normal UA and CBC. Instructed CNM to have pt go home w/ foley w/ leg bag and call office in am to schedule F/U visit w/ Dr. Dareen Piano  In ~2 days.   ASSESSMENT 1. Postoperative Retention Of Urine, initial encounter     PLAN Discharge home in stable condition per consult with Dr. Claiborne Billings.     Follow-up Information   Follow up with Levi Aland, MD. Schedule an appointment as soon as possible for a visit in 2 days.   Contact information:   [address] [place] [address] [phone]       Follow up with [facility]. (As needed if symptoms worsen)    Contact information:   [address] [place] [address] [phone]       Medication List    TAKE these medications       bisacodyl 5 MG EC tablet  Commonly known as:  DULCOLAX  Take 5 mg by mouth daily as needed for constipation.     docusate sodium 100 MG capsule  Commonly known as:  COLACE  Take 100 mg by mouth once.     HYDROmorphone 4 MG tablet  Commonly known as:  DILAUDID  Take 5 mg by mouth every 4 (four) hours as needed for pain.     ibuprofen 200 MG tablet  Commonly known as:  ADVIL,MOTRIN  Take 200 mg by mouth every 6 (six) hours as needed for pain.     multivitamin with minerals Tabs  Take 1 tablet by mouth daily.       [place], CNM 09/15/2012  4:41 AM   0437: Addendum. Pt called MAU w/ name of medication that Dr. Dareen Piano Rx'd on Friday-- Ditropan 5 mg PO QID. Dr. Claiborne Billings notified, discussed urinary retention as possible side-effect. Instructed  pt to stop taking until appt w/ Dr. Dareen Piano.   [place], [place] 09/15/2012 8:38 AM

## 2012-09-15 NOTE — MAU Note (Signed)
Surgery on 5/19 and went home with a urinary catheter. Catheter removed on Friday and was able to urinate with no problems until today. Patient states this is the worst pain since the surgery.

## 2012-09-16 NOTE — Discharge Summary (Signed)
NAMEMERYL, Mindy Schmitt              ACCOUNT NO.:  [id number]  MEDICAL RECORD NO.:  [id number]  LOCATION:  9305                          FACILITY:  [facility]  PHYSICIAN:  Malva Limes, M.D.    DATE OF BIRTH:  [birth year]  DATE OF ADMISSION:  09/07/2012 DATE OF DISCHARGE:  09/09/2012                              DISCHARGE SUMMARY   PRINCIPAL DISCHARGE DIAGNOSES: 1. Symptomatic uterine prolapse. 2. Stress urinary incontinence. 3. Cystocele. 4. Rectocele.  PRINCIPLE PROCEDURES: 1. Total vaginal hysterectomy. 2. Transvaginal urethropexy. 3. Anterior and posterior colporrhaphy.  HISTORY OF PRESENT ILLNESS:  [name] is a 57 year old, white female, who was admitted to [facility] on Sep 07, 2012, because of a several year history of worsening symptomatic prolapse and stress urinary incontinence.  The patient had worsening pressure and heaviness, which was preventing her from working.  She stood as a Runner, broadcasting/film/video all day long.  HOSPITAL COURSE:  The patient was admitted, underwent a total vaginal hysterectomy with transvaginal urethropexy and anterior and posterior colporrhaphy.  The patient's postpartum course was benign.  She underwent a voiding trial on postop day #1, without success.  She was having significant discomfort and tolerating a diet, so therefore she was kept for another day.  On postop day #2, she also underwent a voiding trial without success; however, she was feeling much better. She was discharged to home on postop day #2 to return to the office in 2 days for another voiding trial.  The patient remained afebrile.  Her vital signs were stable throughout the hospitalization.  Her postop hemoglobin was 11.0.  At the time of discharge, the patient was ambulating without difficulty.  She had no vaginal bleeding.  She was tolerating her pain with p.o. pain medicine.          ______________________________ Malva Limes, M.D.     MA/MEDQ  D:  09/15/2012  T:   09/16/2012  Job:  [id number]

## 2012-09-23 NOTE — MAU Provider Note (Signed)
Agree with note. 

## 2013-05-14 ENCOUNTER — Other Ambulatory Visit: Payer: Self-pay | Admitting: Family Medicine

## 2013-05-14 ENCOUNTER — Ambulatory Visit
Admission: RE | Admit: 2013-05-14 | Discharge: 2013-05-14 | Disposition: A | Discharge status: Home / Self Care | Payer: Worker's Compensation | Source: Ambulatory Visit | Attending: Family Medicine | Admitting: Family Medicine

## 2013-05-14 DIAGNOSIS — W19XXXA Unspecified fall, initial encounter: Secondary | ICD-10-CM

## 2013-05-20 ENCOUNTER — Other Ambulatory Visit: Payer: Self-pay | Admitting: Orthopedic Surgery

## 2013-05-20 ENCOUNTER — Encounter (HOSPITAL_COMMUNITY): Payer: Self-pay | Admitting: Pharmacy Technician

## 2013-05-24 ENCOUNTER — Encounter (HOSPITAL_COMMUNITY)
Admission: RE | Admit: 2013-05-24 | Discharge: 2013-05-24 | Disposition: A | Discharge status: Home / Self Care | Payer: Worker's Compensation | Source: Ambulatory Visit | Attending: Orthopedic Surgery | Admitting: Orthopedic Surgery

## 2013-05-24 ENCOUNTER — Encounter (HOSPITAL_COMMUNITY): Payer: Self-pay

## 2013-05-24 LAB — CBC
HCT: 35.8 % — ABNORMAL LOW (ref 36.0–46.0)
Hemoglobin: 11.9 g/dL — ABNORMAL LOW (ref 12.0–15.0)
MCH: 30.9 pg (ref 26.0–34.0)
MCHC: 33.2 g/dL (ref 30.0–36.0)
MCV: 93 fL (ref 78.0–100.0)
PLATELETS: 374 10*3/uL (ref 150–400)
RBC: 3.85 MIL/uL — AB (ref 3.87–5.11)
RDW: 13.3 % (ref 11.5–15.5)
WBC: 9.5 10*3/uL (ref 4.0–10.5)

## 2013-05-24 LAB — BASIC METABOLIC PANEL
BUN: 24 mg/dL — ABNORMAL HIGH (ref 6–23)
CHLORIDE: 99 meq/L (ref 96–112)
CO2: 29 mEq/L (ref 19–32)
Calcium: 9.4 mg/dL (ref 8.4–10.5)
Creatinine, Ser: 0.66 mg/dL (ref 0.50–1.10)
Glucose, Bld: 118 mg/dL — ABNORMAL HIGH (ref 70–99)
POTASSIUM: 4.5 meq/L (ref 3.7–5.3)
Sodium: 140 mEq/L (ref 137–147)

## 2013-05-24 NOTE — Pre-Procedure Instructions (Signed)
Della GooMarian Daversa  05/24/2013   Your procedure is scheduled on:  05/27/13  [facility]  2 * 3 at 845 AM.  Call this number if you have problems the morning of surgery: [phone]   Remember:   Do not eat food or drink liquids after midnight.   Take these medicines the morning of surgery with A SIP OF WATER: hydrocodone   Do not wear jewelry, make-up or nail polish.  Do not wear lotions, powders, or perfumes. You may wear deodorant.  Do not shave 48 hours prior to surgery. Men may shave face and neck.  Do not bring valuables to the hospital.  [facility] is not responsible                  for any belongings or valuables.               Contacts, dentures or bridgework may not be worn into surgery.  Leave suitcase in the car. After surgery it may be brought to your room.  For patients admitted to the hospital, discharge time is determined by your                treatment team.               Patients discharged the day of surgery will not be allowed to drive  home.  Name and phone number of your driver: family  Special Instructions: Shower using CHG 2 nights before surgery and the night before surgery.  If you shower the day of surgery use CHG.  Use special wash - you have one bottle of CHG for all showers.  You should use approximately 1/3 of the bottle for each shower.   Please read over the following fact sheets that you were given: Pain Booklet, Coughing and Deep Breathing and Surgical Site Infection Prevention

## 2013-05-26 ENCOUNTER — Encounter (HOSPITAL_COMMUNITY): Payer: Self-pay | Admitting: Emergency Medicine

## 2013-05-26 ENCOUNTER — Emergency Department (HOSPITAL_COMMUNITY)
Admission: EM | Admit: 2013-05-26 | Discharge: 2013-05-26 | Disposition: A | Discharge status: Home / Self Care | Payer: BC Managed Care – PPO | Attending: Emergency Medicine | Admitting: Emergency Medicine

## 2013-05-26 ENCOUNTER — Emergency Department (HOSPITAL_COMMUNITY): Payer: BC Managed Care – PPO

## 2013-05-26 DIAGNOSIS — N200 Calculus of kidney: Secondary | ICD-10-CM | POA: Insufficient documentation

## 2013-05-26 DIAGNOSIS — R1031 Right lower quadrant pain: Secondary | ICD-10-CM

## 2013-05-26 DIAGNOSIS — Z79899 Other long term (current) drug therapy: Secondary | ICD-10-CM | POA: Insufficient documentation

## 2013-05-26 DIAGNOSIS — R112 Nausea with vomiting, unspecified: Secondary | ICD-10-CM | POA: Insufficient documentation

## 2013-05-26 DIAGNOSIS — Z87442 Personal history of urinary calculi: Secondary | ICD-10-CM | POA: Insufficient documentation

## 2013-05-26 LAB — COMPREHENSIVE METABOLIC PANEL
ALK PHOS: 105 U/L (ref 39–117)
ALT: 39 U/L — AB (ref 0–35)
AST: 36 U/L (ref 0–37)
Albumin: 4 g/dL (ref 3.5–5.2)
BUN: 24 mg/dL — AB (ref 6–23)
CHLORIDE: 101 meq/L (ref 96–112)
CO2: 28 mEq/L (ref 19–32)
Calcium: 9.9 mg/dL (ref 8.4–10.5)
Creatinine, Ser: 1 mg/dL (ref 0.50–1.10)
GFR calc Af Amer: 71 mL/min — ABNORMAL LOW (ref >90)
GFR calc non Af Amer: 61 mL/min — ABNORMAL LOW (ref >90)
Glucose, Bld: 133 mg/dL — ABNORMAL HIGH (ref 70–99)
POTASSIUM: 4.1 meq/L (ref 3.7–5.3)
SODIUM: 141 meq/L (ref 137–147)
TOTAL PROTEIN: 7.7 g/dL (ref 6.0–8.3)

## 2013-05-26 LAB — URINALYSIS, ROUTINE W REFLEX MICROSCOPIC
Bilirubin Urine: NEGATIVE
Glucose, UA: NEGATIVE mg/dL
KETONES UR: NEGATIVE mg/dL
LEUKOCYTES UA: NEGATIVE
NITRITE: NEGATIVE
Protein, ur: NEGATIVE mg/dL
Specific Gravity, Urine: 1.029 (ref 1.005–1.030)
UROBILINOGEN UA: 0.2 mg/dL (ref 0.0–1.0)
pH: 7 (ref 5.0–8.0)

## 2013-05-26 LAB — CBC
HEMATOCRIT: 37.1 % (ref 36.0–46.0)
Hemoglobin: 11.6 g/dL — ABNORMAL LOW (ref 12.0–15.0)
MCH: 29.4 pg (ref 26.0–34.0)
MCHC: 31.3 g/dL (ref 30.0–36.0)
MCV: 93.9 fL (ref 78.0–100.0)
Platelets: 376 10*3/uL (ref 150–400)
RBC: 3.95 MIL/uL (ref 3.87–5.11)
RDW: 13.4 % (ref 11.5–15.5)
WBC: 14.9 10*3/uL — AB (ref 4.0–10.5)

## 2013-05-26 LAB — LIPASE, BLOOD: LIPASE: 59 U/L (ref 11–59)

## 2013-05-26 LAB — URINE MICROSCOPIC-ADD ON

## 2013-05-26 MED ORDER — OXYCODONE-ACETAMINOPHEN 5-325 MG PO TABS: 1.0000 | ORAL_TABLET | Freq: Four times a day (QID) | ORAL | Status: DC | PRN

## 2013-05-26 MED ORDER — IOHEXOL 300 MG/ML  SOLN
50.0000 mL | Freq: Once | INTRAMUSCULAR | Status: AC | PRN
  Administered 2013-05-26: 50 mL via ORAL

## 2013-05-26 MED ORDER — FENTANYL CITRATE 0.05 MG/ML IJ SOLN
50.0000 ug | Freq: Once | INTRAMUSCULAR | Status: AC
  Administered 2013-05-26: 50 ug via INTRAVENOUS
  Filled 2013-05-26: qty 2

## 2013-05-26 MED ORDER — CEFAZOLIN SODIUM-DEXTROSE 2-3 GM-% IV SOLR
2.0000 g | INTRAVENOUS | Status: AC
  Administered 2013-05-27: 2 g via INTRAVENOUS

## 2013-05-26 MED ORDER — HYDROMORPHONE HCL PF 1 MG/ML IJ SOLN
1.0000 mg | Freq: Once | INTRAMUSCULAR | Status: AC
  Administered 2013-05-26: 1 mg via INTRAVENOUS
  Filled 2013-05-26: qty 1

## 2013-05-26 MED ORDER — ONDANSETRON 4 MG PO TBDP: ORAL_TABLET | ORAL | Status: DC

## 2013-05-26 MED ORDER — SODIUM CHLORIDE 0.9 % IV BOLUS (SEPSIS)
1000.0000 mL | Freq: Once | INTRAVENOUS | Status: AC
Start: 2013-05-26 — End: 2013-05-26
  Administered 2013-05-26: 1000 mL via INTRAVENOUS

## 2013-05-26 MED ORDER — ONDANSETRON HCL 4 MG/2ML IJ SOLN
4.0000 mg | Freq: Once | INTRAMUSCULAR | Status: AC
  Administered 2013-05-26: 4 mg via INTRAVENOUS
  Filled 2013-05-26: qty 2

## 2013-05-26 MED ORDER — IOHEXOL 300 MG/ML  SOLN
100.0000 mL | Freq: Once | INTRAMUSCULAR | Status: AC | PRN
  Administered 2013-05-26: 100 mL via INTRAVENOUS

## 2013-05-26 MED ORDER — POVIDONE-IODINE 7.5 % EX SOLN
Freq: Once | CUTANEOUS | Status: DC
  Filled 2013-05-26: qty 118

## 2013-05-26 NOTE — ED Notes (Signed)
Per pt started having RLQ pain about 2 hours ago-suppose to have surgery tomorrow-N/V

## 2013-05-26 NOTE — ED Notes (Signed)
Pt aware of the need for a urine sample. 

## 2013-05-26 NOTE — ED Notes (Signed)
Bed: WA10 Expected date:  Expected time:  Means of arrival:  Comments: Hold for triage  

## 2013-05-26 NOTE — Discharge Instructions (Signed)
Kidney Stones  Kidney stones (urolithiasis) are deposits that form inside your kidneys. The intense pain is caused by the stone moving through the urinary tract. When the stone moves, the ureter goes into spasm around the stone. The stone is usually passed in the urine.   CAUSES   · A disorder that makes certain neck glands produce too much parathyroid hormone (primary hyperparathyroidism).  · A buildup of uric acid crystals, similar to gout in your joints.  · Narrowing (stricture) of the ureter.  · A kidney obstruction present at birth (congenital obstruction).  · Previous surgery on the kidney or ureters.  · Numerous kidney infections.  SYMPTOMS   · Feeling sick to your stomach (nauseous).  · Throwing up (vomiting).  · Blood in the urine (hematuria).  · Pain that usually spreads (radiates) to the groin.  · Frequency or urgency of urination.  DIAGNOSIS   · Taking a history and physical exam.  · Blood or urine tests.  · CT scan.  · Occasionally, an examination of the inside of the urinary bladder (cystoscopy) is performed.  TREATMENT   · Observation.  · Increasing your fluid intake.  · Extracorporeal shock wave lithotripsy This is a noninvasive procedure that uses shock waves to break up kidney stones.  · Surgery may be needed if you have severe pain or persistent obstruction. There are various surgical procedures. Most of the procedures are performed with the use of small instruments. Only small incisions are needed to accommodate these instruments, so recovery time is minimized.  The size, location, and chemical composition are all important variables that will determine the proper choice of action for you. Talk to your health care provider to better understand your situation so that you will minimize the risk of injury to yourself and your kidney.   HOME CARE INSTRUCTIONS   · Drink enough water and fluids to keep your urine clear or pale yellow. This will help you to pass the stone or stone fragments.  · Strain  all urine through the provided strainer. Keep all particulate matter and stones for your health care provider to see. The stone causing the pain may be as small as a grain of salt. It is very important to use the strainer each and every time you pass your urine. The collection of your stone will allow your health care provider to analyze it and verify that a stone has actually passed. The stone analysis will often identify what you can do to reduce the incidence of recurrences.  · Only take over-the-counter or prescription medicines for pain, discomfort, or fever as directed by your health care provider.  · Make a follow-up appointment with your health care provider as directed.  · Get follow-up X-rays if required. The absence of pain does not always mean that the stone has passed. It may have only stopped moving. If the urine remains completely obstructed, it can cause loss of kidney function or even complete destruction of the kidney. It is your responsibility to make sure X-rays and follow-ups are completed. Ultrasounds of the kidney can show blockages and the status of the kidney. Ultrasounds are not associated with any radiation and can be performed easily in a matter of minutes.  SEEK MEDICAL CARE IF:  · You experience pain that is progressive and unresponsive to any pain medicine you have been prescribed.  SEEK IMMEDIATE MEDICAL CARE IF:   · Pain cannot be controlled with the prescribed medicine.  · You have a fever   or shaking chills.  · The severity or intensity of pain increases over 18 hours and is not relieved by pain medicine.  · You develop a new onset of abdominal pain.  · You feel faint or pass out.  · You are unable to urinate.  MAKE SURE YOU:   · Understand these instructions.  · Will watch your condition.  · Will get help right away if you are not doing well or get worse.  Document Released: 04/08/2005 Document Revised: 12/09/2012 Document Reviewed: 09/09/2012  Exit[employer]® Patient Information ©2014  [employer].

## 2013-05-26 NOTE — ED Provider Notes (Signed)
CSN: [id number]     Arrival date & time 05/26/13  1658 History   First MD Initiated Contact with Patient 05/26/13 1733     Chief Complaint  Patient presents with  . RLQ pain    (Consider location/radiation/quality/duration/timing/severity/associated sxs/prior Treatment) Patient is a 58 y.o. female presenting with abdominal pain. The history is provided by the patient.  Abdominal Pain Pain location:  RLQ Pain quality: sharp   Pain radiates to:  Does not radiate Pain severity:  Moderate Onset quality:  Sudden Timing:  Constant Progression:  Unchanged Chronicity:  New Context: not recent illness, not recent travel and not trauma   Relieved by:  Nothing Worsened by:  Nothing tried Associated symptoms: nausea and vomiting   Associated symptoms: no chest pain, no chills and no fever     Past Medical History  Diagnosis Date  . Medical history non-contributory    Past Surgical History  Procedure Laterality Date  . Wrist fracture surgery Left   . Vaginal hysterectomy N/A 09/07/2012    Procedure: HYSTERECTOMY VAGINAL;  Surgeon: Levi AlandMark E Anderson, MD;  Location: [facility];  Service: Gynecology;  Laterality: N/A;  . Anterior and posterior repair N/A 09/07/2012    Procedure: ANTERIOR (CYSTOCELE) AND POSTERIOR REPAIR (RECTOCELE);  Surgeon: Levi AlandMark E Anderson, MD;  Location: [facility];  Service: Gynecology;  Laterality: N/A;  . Bladder suspension N/A 09/07/2012    Procedure: TRANSVAGINAL TAPE (TVT) PROCEDURE with CYSTO;  Surgeon: Levi AlandMark E Anderson, MD;  Location: [facility];  Service: Gynecology;  Laterality: N/A;  . Abdominal hysterectomy     Family History  Problem Relation Age of Onset  . Cancer Other    History  Substance Use Topics  . Smoking status: Never Smoker   . Smokeless tobacco: Not on file  . Alcohol Use: No   OB History   Grav Para Term Preterm Abortions TAB SAB Ect Mult Living                 Review of Systems  Constitutional: Negative for fever and chills.  Cardiovascular:  Negative for chest pain and leg swelling.  Gastrointestinal: Positive for nausea, vomiting and abdominal pain.  All other systems reviewed and are negative.    Allergies  Review of patient's allergies indicates no known allergies.  Home Medications   Current Outpatient Rx  Name  Route  Sig  Dispense  Refill  . desvenlafaxine (PRISTIQ) 50 MG 24 hr tablet   Oral   Take 50 mg by mouth daily.         Marland Kitchen. HYDROcodone-acetaminophen (NORCO/VICODIN) 5-325 MG per tablet   Oral   Take 2 tablets by mouth at bedtime as needed for moderate pain.         . Multiple Vitamin (MULTIVITAMIN WITH MINERALS) TABS   Oral   Take 1 tablet by mouth daily.   30 tablet   0   . traMADol (ULTRAM) 50 MG tablet   Oral   Take 50 mg by mouth 3 (three) times daily as needed for moderate pain.         Marland Kitchen. zolpidem (AMBIEN) 10 MG tablet   Oral   Take 10 mg by mouth at bedtime as needed for sleep.          BP 167/94  Pulse 67  Temp(Src) 98.3 F (36.8 C) (Oral)  Resp 21  SpO2 96% Physical Exam  Nursing note and vitals reviewed. Constitutional: She is oriented to person, place, and time. She appears well-developed and well-nourished.  No distress.  HENT:  Head: Normocephalic and atraumatic.  Eyes: EOM are normal. Pupils are equal, round, and reactive to light.  Neck: Normal range of motion. Neck supple.  Cardiovascular: Normal rate and regular rhythm.  Exam reveals no friction rub.   No murmur heard. Pulmonary/Chest: Effort normal and breath sounds normal. No respiratory distress. She has no wheezes. She has no rales.  Abdominal: Soft. She exhibits no distension. There is tenderness (RLQ). There is no rebound.  Musculoskeletal: Normal range of motion. She exhibits no edema.  Neurological: She is alert and oriented to person, place, and time. She exhibits normal muscle tone.  Skin: No rash noted. She is not diaphoretic.    ED Course  Procedures (including critical care time) Labs Review Labs  Reviewed  CBC  COMPREHENSIVE METABOLIC PANEL  LIPASE, BLOOD  URINALYSIS, ROUTINE W REFLEX MICROSCOPIC   Imaging Review Ct Abdomen Pelvis W Contrast  05/26/2013   CLINICAL DATA:  Right flank pain extending into the right lower quadrant, with nausea and vomiting.  EXAM: CT ABDOMEN AND PELVIS WITH CONTRAST  TECHNIQUE: Multidetector CT imaging of the abdomen and pelvis was performed using the standard protocol following bolus administration of intravenous contrast.  CONTRAST:  OMNIPAQUE IOHEXOL 300 MG/ML  SOLN  COMPARISON:  None.  FINDINGS: Lingular scarring versus subsegmental atelectasis. Small hiatal hernia with contrast in the distal esophagus suggesting gastroesophageal reflux.  The liver, spleen, pancreas, and adrenal glands appear unremarkable. No specific gallbladder or biliary abnormality identified. Delayed right renal enhancement with right hydronephrosis and hydroureter extending down to a 1.2 x 0.6 x 0.7 cm bilobed stone or a cluster of 2 calculi in the right distal ureter just past the iliac vessel cross over. No significant right hydroureter distal to this point. No additional ureteral calculi or bladder calculus. No left-sided calculus.  Uterus absent. No significant adnexal lesion. Prominent cecum is likely incidental. High density in a tubular right pelvic structure thought to represent appendix, possibly contrast medium or appendicoliths.  Low position of anorectal angle with respect to the pubococcygeal line could reflect pelvic floor laxity.  IMPRESSION: 1. Obstructive 1.2 x 0.6 x 0.7 cm stone in the right ureter just distal to the iliac vessel cross over, causing delayed right nephrogram, hydronephrosis, and hydroureter. 2. 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 3. Small hiatal hernia with contrast in the distal esophagus suggesting gastroesophageal reflux. 4. Lingular scarring versus subsegmental atelectasis.   Electronically Signed   By: Herbie Baltimore M.D.   On: 05/26/2013 20:09    EKG  Interpretation   None       MDM   1. Kidney stone   2. RLQ abdominal pain    2F here with RLQ. Began earlier, associated nausea, vomiting. No fevers. No dysuria. AFVSS here. On exam, RLQ pain. No radiation. No hernias noted. Concern for appendicitis.  CT shows large kidney stone with hydroureter and mild hydronephrosis. Mild leukocytosis.  Dr. Lattie Haw with Urology recommends f/u in clinic in the next 2-3 days. Given pain meds, zofran. Stable for discharge.     Dagmar Hait, MD 05/26/13 [phone]

## 2013-05-27 ENCOUNTER — Encounter (HOSPITAL_COMMUNITY)
Admission: RE | Disposition: A | Discharge status: Home / Self Care | Payer: Self-pay | Source: Ambulatory Visit | Attending: Orthopedic Surgery

## 2013-05-27 ENCOUNTER — Encounter (HOSPITAL_COMMUNITY): Payer: Worker's Compensation | Admitting: Certified Registered"

## 2013-05-27 ENCOUNTER — Encounter (HOSPITAL_COMMUNITY): Payer: Self-pay | Admitting: Certified Registered"

## 2013-05-27 ENCOUNTER — Ambulatory Visit (HOSPITAL_COMMUNITY)
Admission: RE | Admit: 2013-05-27 | Discharge: 2013-05-27 | Disposition: A | Discharge status: Home / Self Care | Payer: Worker's Compensation | Source: Ambulatory Visit | Attending: Orthopedic Surgery | Admitting: Orthopedic Surgery

## 2013-05-27 ENCOUNTER — Ambulatory Visit (HOSPITAL_COMMUNITY): Payer: Worker's Compensation | Admitting: Certified Registered"

## 2013-05-27 ENCOUNTER — Ambulatory Visit (HOSPITAL_COMMUNITY): Payer: Worker's Compensation

## 2013-05-27 DIAGNOSIS — Y990 Civilian activity done for income or pay: Secondary | ICD-10-CM | POA: Insufficient documentation

## 2013-05-27 DIAGNOSIS — Z01812 Encounter for preprocedural laboratory examination: Secondary | ICD-10-CM | POA: Insufficient documentation

## 2013-05-27 DIAGNOSIS — S82002A Unspecified fracture of left patella, initial encounter for closed fracture: Secondary | ICD-10-CM

## 2013-05-27 DIAGNOSIS — W19XXXA Unspecified fall, initial encounter: Secondary | ICD-10-CM | POA: Insufficient documentation

## 2013-05-27 DIAGNOSIS — S82009A Unspecified fracture of unspecified patella, initial encounter for closed fracture: Secondary | ICD-10-CM | POA: Insufficient documentation

## 2013-05-27 HISTORY — DX: Headache: R51

## 2013-05-27 HISTORY — PX: ORIF PATELLA: SHX5033

## 2013-05-27 SURGERY — OPEN REDUCTION INTERNAL FIXATION (ORIF) PATELLA
Anesthesia: General | Site: Shoulder | Laterality: Left

## 2013-05-27 MED ORDER — FENTANYL CITRATE 0.05 MG/ML IJ SOLN
INTRAMUSCULAR | Status: AC
  Administered 2013-05-27: 25 ug
  Administered 2013-05-27: 75 ug
  Filled 2013-05-27: qty 2

## 2013-05-27 MED ORDER — MIDAZOLAM HCL 2 MG/2ML IJ SOLN
INTRAMUSCULAR | Status: AC
  Administered 2013-05-27: 1.5 mg
  Filled 2013-05-27: qty 2

## 2013-05-27 MED ORDER — HYDROMORPHONE HCL PF 1 MG/ML IJ SOLN
0.2500 mg | INTRAMUSCULAR | Status: DC | PRN
  Administered 2013-05-27 (×3): 0.5 mg via INTRAVENOUS

## 2013-05-27 MED ORDER — ROCURONIUM BROMIDE 50 MG/5ML IV SOLN
INTRAVENOUS | Status: AC
  Filled 2013-05-27: qty 1

## 2013-05-27 MED ORDER — EPHEDRINE SULFATE 50 MG/ML IJ SOLN
INTRAMUSCULAR | Status: AC
  Filled 2013-05-27: qty 1

## 2013-05-27 MED ORDER — PROPOFOL 10 MG/ML IV BOLUS
INTRAVENOUS | Status: DC | PRN
  Administered 2013-05-27: 120 mg via INTRAVENOUS

## 2013-05-27 MED ORDER — LACTATED RINGERS IV SOLN
INTRAVENOUS | Status: DC | PRN
  Administered 2013-05-27: 10:00:00 via INTRAVENOUS

## 2013-05-27 MED ORDER — 0.9 % SODIUM CHLORIDE (POUR BTL) OPTIME
TOPICAL | Status: DC | PRN
  Administered 2013-05-27: 1000 mL

## 2013-05-27 MED ORDER — PROPOFOL 10 MG/ML IV BOLUS
INTRAVENOUS | Status: AC
  Filled 2013-05-27: qty 20

## 2013-05-27 MED ORDER — HYDROMORPHONE HCL PF 1 MG/ML IJ SOLN
INTRAMUSCULAR | Status: AC
  Administered 2013-05-27: 0.5 mg via INTRAVENOUS
  Filled 2013-05-27: qty 1

## 2013-05-27 MED ORDER — LIDOCAINE HCL (CARDIAC) 20 MG/ML IV SOLN
INTRAVENOUS | Status: AC
  Filled 2013-05-27: qty 5

## 2013-05-27 MED ORDER — FENTANYL CITRATE 0.05 MG/ML IJ SOLN
INTRAMUSCULAR | Status: AC
  Filled 2013-05-27: qty 5

## 2013-05-27 MED ORDER — LIDOCAINE HCL (CARDIAC) 20 MG/ML IV SOLN
INTRAVENOUS | Status: DC | PRN
  Administered 2013-05-27: 60 mg via INTRAVENOUS

## 2013-05-27 MED ORDER — PHENYLEPHRINE HCL 10 MG/ML IJ SOLN
INTRAMUSCULAR | Status: DC | PRN
  Administered 2013-05-27: 80 ug via INTRAVENOUS

## 2013-05-27 MED ORDER — FENTANYL CITRATE 0.05 MG/ML IJ SOLN
INTRAMUSCULAR | Status: DC | PRN
  Administered 2013-05-27 (×2): 25 ug via INTRAVENOUS

## 2013-05-27 MED ORDER — PHENYLEPHRINE 40 MCG/ML (10ML) SYRINGE FOR IV PUSH (FOR BLOOD PRESSURE SUPPORT)
PREFILLED_SYRINGE | INTRAVENOUS | Status: AC
  Filled 2013-05-27: qty 10

## 2013-05-27 MED ORDER — SODIUM CHLORIDE 0.9 % IJ SOLN
INTRAMUSCULAR | Status: AC
  Filled 2013-05-27: qty 10

## 2013-05-27 MED ORDER — MIDAZOLAM HCL 2 MG/2ML IJ SOLN
INTRAMUSCULAR | Status: AC
  Filled 2013-05-27: qty 2

## 2013-05-27 MED ORDER — PROMETHAZINE HCL 25 MG/ML IJ SOLN
INTRAMUSCULAR | Status: AC
  Administered 2013-05-27: 6.25 mg via INTRAVENOUS
  Filled 2013-05-27: qty 1

## 2013-05-27 MED ORDER — ROPIVACAINE HCL 5 MG/ML IJ SOLN
INTRAMUSCULAR | Status: DC | PRN
  Administered 2013-05-27: 30 mL via PERINEURAL

## 2013-05-27 MED ORDER — OXYCODONE HCL 5 MG/5ML PO SOLN: 5.0000 mg | Freq: Once | ORAL | Status: DC | PRN

## 2013-05-27 MED ORDER — PROMETHAZINE HCL 25 MG/ML IJ SOLN
6.2500 mg | INTRAMUSCULAR | Status: AC | PRN
  Administered 2013-05-27 (×2): 6.25 mg via INTRAVENOUS

## 2013-05-27 MED ORDER — ONDANSETRON HCL 4 MG/2ML IJ SOLN
INTRAMUSCULAR | Status: DC | PRN
Start: 2013-05-27 — End: 2013-05-27
  Administered 2013-05-27: 4 mg via INTRAVENOUS

## 2013-05-27 MED ORDER — OXYCODONE HCL 5 MG PO TABS
5.0000 mg | ORAL_TABLET | Freq: Once | ORAL | Status: DC | PRN
Start: 2013-05-27 — End: 2013-05-27

## 2013-05-27 SURGICAL SUPPLY — 60 items
BANDAGE ELASTIC 6 VELCRO ST LF (GAUZE/BANDAGES/DRESSINGS) ×2 IMPLANT
BANDAGE ESMARK 6X9 LF (GAUZE/BANDAGES/DRESSINGS) ×1 IMPLANT
BANDAGE GAUZE ELAST BULKY 4 IN (GAUZE/BANDAGES/DRESSINGS) ×2 IMPLANT
BIT DRILL 2.9 CANN QC NONSTRL (BIT) ×2 IMPLANT
BNDG CMPR 9X6 STRL LF SNTH (GAUZE/BANDAGES/DRESSINGS) ×1
BNDG COHESIVE 4X5 TAN STRL (GAUZE/BANDAGES/DRESSINGS) ×3 IMPLANT
BNDG COHESIVE 6X5 TAN STRL LF (GAUZE/BANDAGES/DRESSINGS) ×3 IMPLANT
BNDG ESMARK 6X9 LF (GAUZE/BANDAGES/DRESSINGS) ×3
CHLORAPREP W/TINT 26ML (MISCELLANEOUS) ×3 IMPLANT
CLOSURE STERI-STRIP 1/2X4 (GAUZE/BANDAGES/DRESSINGS) ×1
CLSR STERI-STRIP ANTIMIC 1/2X4 (GAUZE/BANDAGES/DRESSINGS) ×1 IMPLANT
COVER SURGICAL LIGHT HANDLE (MISCELLANEOUS) ×3 IMPLANT
CUFF TOURNIQUET SINGLE 34IN LL (TOURNIQUET CUFF) IMPLANT
CUFF TOURNIQUET SINGLE 44IN (TOURNIQUET CUFF) IMPLANT
DRAPE C-ARM 42X72 X-RAY (DRAPES) ×3 IMPLANT
DRAPE C-ARMOR (DRAPES) ×3 IMPLANT
DRAPE INCISE IOBAN 66X45 STRL (DRAPES) IMPLANT
DRAPE U-SHAPE 47X51 STRL (DRAPES) ×3 IMPLANT
DRSG EMULSION OIL 3X3 NADH (GAUZE/BANDAGES/DRESSINGS) ×3 IMPLANT
DRSG PAD ABDOMINAL 8X10 ST (GAUZE/BANDAGES/DRESSINGS) ×4 IMPLANT
ELECT REM PT RETURN 9FT ADLT (ELECTROSURGICAL) ×3
ELECTRODE REM PT RTRN 9FT ADLT (ELECTROSURGICAL) ×1 IMPLANT
GLOVE BIO SURGEON STRL SZ7 (GLOVE) ×3 IMPLANT
GLOVE BIO SURGEON STRL SZ7.5 (GLOVE) ×3 IMPLANT
GLOVE BIOGEL PI IND STRL 7.0 (GLOVE) ×1 IMPLANT
GLOVE BIOGEL PI IND STRL 8 (GLOVE) ×1 IMPLANT
GLOVE BIOGEL PI INDICATOR 7.0 (GLOVE) ×2
GLOVE BIOGEL PI INDICATOR 8 (GLOVE) ×2
GOWN PREVENTION PLUS LG XLONG (DISPOSABLE) ×3 IMPLANT
GOWN STRL NON-REIN LRG LVL3 (GOWN DISPOSABLE) ×6 IMPLANT
K-WIRE 1.6MM THD TROCAR TIP NS (WIRE) ×6
KIT BASIN OR (CUSTOM PROCEDURE TRAY) ×3 IMPLANT
KIT ROOM TURNOVER OR (KITS) ×3 IMPLANT
KWIRE 1.6MM THD TROCAR TIP NS (WIRE) IMPLANT
MANIFOLD NEPTUNE II (INSTRUMENTS) ×3 IMPLANT
NDL HYPO 25X1 1.5 SAFETY (NEEDLE) ×1 IMPLANT
NEEDLE HYPO 25X1 1.5 SAFETY (NEEDLE) ×3 IMPLANT
PACK ORTHO EXTREMITY (CUSTOM PROCEDURE TRAY) ×3 IMPLANT
PAD ARMBOARD 7.5X6 YLW CONV (MISCELLANEOUS) ×6 IMPLANT
PADDING CAST COTTON 6X4 STRL (CAST SUPPLIES) ×3 IMPLANT
RETRIEVER SUT HEWSON (MISCELLANEOUS) ×3 IMPLANT
SCREW LAG  RD HEAD 4.0 38 LTH (Screw) ×4 IMPLANT
SCREW LAG RD HEAD 4.0 38 LTH (Screw) IMPLANT
SPONGE GAUZE 4X4 12PLY (GAUZE/BANDAGES/DRESSINGS) ×3 IMPLANT
SPONGE GAUZE 4X4 12PLY STER LF (GAUZE/BANDAGES/DRESSINGS) ×2 IMPLANT
SPONGE LAP 18X18 X RAY DECT (DISPOSABLE) ×3 IMPLANT
STAPLER VISISTAT 35W (STAPLE) IMPLANT
SUT ETHILON 2 0 FS 18 (SUTURE) IMPLANT
SUT FIBERWIRE #2 38 T-5 BLUE (SUTURE) ×3
SUT VIC AB 0 CT1 27 (SUTURE)
SUT VIC AB 0 CT1 27XBRD ANBCTR (SUTURE) IMPLANT
SUT VIC AB 2-0 CT1 27 (SUTURE) ×3
SUT VIC AB 2-0 CT1 TAPERPNT 27 (SUTURE) ×1 IMPLANT
SUTURE FIBERWR #2 38 T-5 BLUE (SUTURE) ×1 IMPLANT
SYR CONTROL 10ML LL (SYRINGE) ×3 IMPLANT
TOWEL OR 17X24 6PK STRL BLUE (TOWEL DISPOSABLE) ×3 IMPLANT
TUBE CONNECTING 12'X1/4 (SUCTIONS) ×1
TUBE CONNECTING 12X1/4 (SUCTIONS) ×2 IMPLANT
WATER STERILE IRR 1000ML POUR (IV SOLUTION) ×3 IMPLANT
YANKAUER SUCT BULB TIP NO VENT (SUCTIONS) ×3 IMPLANT

## 2013-05-27 NOTE — Anesthesia Postprocedure Evaluation (Signed)
  Anesthesia Post-op Note  Patient: Mindy Schmitt  Procedure(s) Performed: Procedure(s) with comments: OPEN REDUCTION INTERNAL (ORIF) FIXATION PATELLA (Left) - Open reduction internal fixation left patella fracture  Patient Location: PACU and SICU  Anesthesia Type:GA combined with regional for post-op pain  Level of Consciousness: awake and alert   Airway and Oxygen Therapy: Patient Spontanous Breathing  Post-op Pain: mild, still c some pain despite add canal block  Post-op Assessment: Post-op Vital signs reviewed  Post-op Vital Signs: stable  Complications: No apparent anesthesia complications

## 2013-05-27 NOTE — Op Note (Signed)
Procedure(s): OPEN REDUCTION INTERNAL (ORIF) FIXATION PATELLA Procedure Note  Mindy Schmitt female 58 y.o. 05/27/2013  Procedure(s) and Anesthesia Type:    * OPEN REDUCTION INTERNAL (ORIF) FIXATION LEFT PATELLA - Choice  Surgeon(s) and Role:    * Mable Paris, MD - Primary   Indications:  58 y.o. female s/p fall with left patella fracture. Initial film showed minimal displacement. Subsequent followup film showed further anterior displacement of fracture. We had a discussion about continued nonoperative management versus operative treatment to close down the fracture, allow more anatomic healing, and prevent further displacement. She wanted to go forward with surgery. All questions were welcomed and answered.     Surgeon: Mable Paris   Assistants: Damita Lack PA-C [facility] was present and scrubbed throughout the procedure and was essential in positioning, retraction, exposure, and closure)  Anesthesia: General endotracheal anesthesia with preoperative regional block given by the attending anesthesiologist    Procedure Detail  OPEN REDUCTION INTERNAL (ORIF) FIXATION PATELLA  Findings: Anatomic reduction of the fracture with 2 4-0 partially-threaded cannulated screws with a #2 FiberWire tension band construct.  Estimated Blood Loss:  Minimal         Drains: none  Blood Given: none         Specimens: none        Complications:  * No complications entered in OR log *         Disposition: PACU - hemodynamically stable.         Condition: stable    Procedure:   DESCRIPTION OF PROCEDURE: The patient was identified in preoperative  holding area where I personally marked the operative site after  verifying site, side, and procedure with the patient. The patient was taken back  to the operating room where general anesthesia was induced without  complication and was kept in the supine position with a bump under her hip. A nonsterile tourniquet  was applied to the upper thigh. The left lower extremity was then prepped and draped in the standard sterile fashion. The appropriate timeout was carried out. The patient did receive preoperative antibiotics. The limb was exsanguinated using an Esmarch dressing. The tourniquet was elevated to 350 mm mercury. A. approximately 6 cm incision was made over the anterior patella. Medial and lateral skin flaps were elevated. There is no disruption of the retinaculum medial lateral or an anterior patella. Therefore a reduction clamp was placed in the superior and inferior aspect of the patella and used to reduce the fracture. Guidepin from the 4-0 cannulated screw set were then placed superior to inferior and medial and lateral to the clamp across the fracture centered in the patella. The appropriate length was measured and the guide pins were overdrilled. 38 mm screws were then placed medial and lateral with good compression at the fracture site. A suture passer was then used to past 2 #2 fiber wires in a figure-of-eight fashion through the screws and tied over the anterior patella. The final construct was felt to be excellent. Final fluoroscopic imaging in AP and lateral planes showed anatomic reduction of the fracture with appropriate length and placement of the screws. The wound was irrigated and subsequently closed in layers with 2-0 Vicryl in a deep dermal layer, 4-0 Monocryl for skin closure and Steri-Strips. A light sterile dressing was applied and the patient was placed back in a knee immobilizer. The tourniquet was let down for total tourniquet time approximately 35 minutes at 350 mm mercury. She was then allowed to awaken from  anesthesia, transferred to the stretcher and taken to the recovery room in stable condition.  Postoperative plan: She'll be discharged home today with her family in the knee immobilizer, weightbearing as tolerated. She will followup in my office in 2 weeks at which point we'll transition  her to a hinged knee brace and allow some early motion..Marland Kitchen

## 2013-05-27 NOTE — Preoperative (Signed)
Beta Blockers   Reason not to administer Beta Blockers:Not Applicable 

## 2013-05-27 NOTE — Anesthesia Preprocedure Evaluation (Signed)
Anesthesia Evaluation  Patient identified by MRN, date of birth, ID band Patient awake    Reviewed: Allergy & Precautions, H&P , NPO status , Patient's Chart, lab work & pertinent test results  History of Anesthesia Complications Negative for: history of anesthetic complications  Airway Mallampati: I      Dental   Pulmonary          Cardiovascular negative cardio ROS  Rhythm:Regular Rate:Normal     Neuro/Psych    GI/Hepatic negative GI ROS, Neg liver ROS,   Endo/Other  negative endocrine ROS  Renal/GU negative Renal ROS     Musculoskeletal   Abdominal   Peds  Hematology   Anesthesia Other Findings   Reproductive/Obstetrics                           Anesthesia Physical Anesthesia Plan  ASA: I  Anesthesia Plan: General   Post-op Pain Management:    Induction: Intravenous  Airway Management Planned: LMA  Additional Equipment:   Intra-op Plan:   Post-operative Plan: Extubation in OR  Informed Consent: I have reviewed the patients History and Physical, chart, labs and discussed the procedure including the risks, benefits and alternatives for the proposed anesthesia with the patient or authorized representative who has indicated his/her understanding and acceptance.   Dental advisory given  Plan Discussed with: CRNA and Surgeon  Anesthesia Plan Comments:         Anesthesia Quick Evaluation

## 2013-05-27 NOTE — Discharge Instructions (Signed)
Discharge Instructions after Knee surgery   You will have a light dressing on your knee.  Leave the dressing in place until the third day after your surgery, okay to rewrap the ACE over the incision, starting from the shin and wrapping to the thigh Okay to get incision wet after 5 days Pump your foot up and down 20 times per hour, every hour you are awake.  Apply ice to the knee 3 times per day for 30 minutes for the first 1 week until your knee is feeling comfortable again. Do not use heat.  Okay to bear weight on affected leg in the knee immbolizer  Pain medicine has been prescribed for you.  Use your medicine as needed over the first 48 hours, and then you can begin to taper your use. You may take Extra Strength Tylenol or Tylenol only in place of the pain pills.  Take one 81mg  aspirin daily for 3 weeks post-operatively unless it upsets your stomach.   Please call [phone] during normal business hours or [phone] after hours for any problems. Including the following:  - excessive redness of the incisions - drainage for more than 4 days - fever of more than 101.5 F  *Please note that pain medications will not be refilled after hours or on weekends.   What to eat:  For your first meals, you should eat lightly; only small meals initially.  If you do not have nausea, you may eat larger meals.  Avoid spicy, greasy and heavy food.    General Anesthesia, Adult, Care After  Refer to this sheet in the next few weeks. These instructions provide you with information on caring for yourself after your procedure. Your health care provider may also give you more specific instructions. Your treatment has been planned according to current medical practices, but problems sometimes occur. Call your health care provider if you have any problems or questions after your procedure.  WHAT TO EXPECT AFTER THE PROCEDURE  After the procedure, it is typical to experience:  Sleepiness.  Nausea and  vomiting. HOME CARE INSTRUCTIONS  For the first 24 hours after general anesthesia:  Have a responsible person with you.  Do not drive a car. If you are alone, do not take public transportation.  Do not drink alcohol.  Do not take medicine that has not been prescribed by your health care provider.  Do not sign important papers or make important decisions.  You may resume a normal diet and activities as directed by your health care provider.  Change bandages (dressings) as directed.  If you have questions or problems that seem related to general anesthesia, call the hospital and ask for the anesthetist or anesthesiologist on call. SEEK MEDICAL CARE IF:  You have nausea and vomiting that continue the day after anesthesia.  You develop a rash. SEEK IMMEDIATE MEDICAL CARE IF:  You have difficulty breathing.  You have chest pain.  You have any allergic problems. Document Released: 07/15/2000 Document Revised: 12/09/2012 Document Reviewed: 10/22/2012  [facility] Patient Information 2014 [place], [place].   What to eat:  For your first meals, you should eat lightly; only small meals initially.  If you do not have nausea, you may eat larger meals.  Avoid spicy, greasy and heavy food.    General Anesthesia, Adult, Care After  Refer to this sheet in the next few weeks. These instructions provide you with information on caring for yourself after your procedure. Your health care provider may also give you more  specific instructions. Your treatment has been planned according to current medical practices, but problems sometimes occur. Call your health care provider if you have any problems or questions after your procedure.  WHAT TO EXPECT AFTER THE PROCEDURE  After the procedure, it is typical to experience:  Sleepiness.  Nausea and vomiting. HOME CARE INSTRUCTIONS  For the first 24 hours after general anesthesia:  Have a responsible person with you.  Do not drive a car. If you are alone, do not  take public transportation.  Do not drink alcohol.  Do not take medicine that has not been prescribed by your health care provider.  Do not sign important papers or make important decisions.  You may resume a normal diet and activities as directed by your health care provider.  Change bandages (dressings) as directed.  If you have questions or problems that seem related to general anesthesia, call the hospital and ask for the anesthetist or anesthesiologist on call. SEEK MEDICAL CARE IF:  You have nausea and vomiting that continue the day after anesthesia.  You develop a rash. SEEK IMMEDIATE MEDICAL CARE IF:  You have difficulty breathing.  You have chest pain.  You have any allergic problems. Document Released: 07/15/2000 Document Revised: 12/09/2012 Document Reviewed: 10/22/2012  [facility] Patient Information 2014 [place].

## 2013-05-27 NOTE — Transfer of Care (Signed)
Immediate Anesthesia Transfer of Care Note  Patient: Mindy Schmitt  Procedure(s) Performed: Procedure(s) with comments: OPEN REDUCTION INTERNAL (ORIF) FIXATION PATELLA (Left) - Open reduction internal fixation left patella fracture  Patient Location: PACU  Anesthesia Type:GA combined with regional for post-op pain  Level of Consciousness: awake, alert , oriented and patient cooperative  Airway & Oxygen Therapy: Patient Spontanous Breathing and Patient connected to nasal cannula oxygen  Post-op Assessment: Report given to PACU RN, Post -op Vital signs reviewed and stable and Patient moving all extremities  Post vital signs: Reviewed and stable  Complications: No apparent anesthesia complications

## 2013-05-27 NOTE — Anesthesia Procedure Notes (Addendum)
Procedure Name: LMA Insertion Date/Time: 05/27/2013 10:24 AM Performed by: Jerilee HohMUMM, VALERIE N Pre-anesthesia Checklist: Patient identified, Emergency Drugs available, Suction available and Patient being monitored Patient Re-evaluated:Patient Re-evaluated prior to inductionOxygen Delivery Method: Circle system utilized Preoxygenation: Pre-oxygenation with 100% oxygen Intubation Type: IV induction LMA: LMA inserted LMA Size: 4.0 Tube type: Oral Number of attempts: 1 Placement Confirmation: positive ETCO2 and breath sounds checked- equal and bilateral Tube secured with: Tape Dental Injury: Teeth and Oropharynx as per pre-operative assessment    Anesthesia Regional Block:  Adductor canal block  Pre-Anesthetic Checklist: ,, timeout performed, Correct Patient, Correct Site, Correct Laterality, Correct Procedure, Correct Position, site marked, Risks and benefits discussed, Surgical consent,  Pre-op evaluation,  Post-op pain management  Laterality: Left and Upper  Prep: chloraprep       Needles:   Needle Type: Echogenic Needle     Needle Length:cm 9 cm Needle Gauge: 22 and 22 G  Needle insertion depth: 6 cm   Additional Needles:  Procedures: ultrasound guided (picture in chart) Adductor canal block Narrative:  Start time: 05/27/2013 9:50 AM End time: 05/27/2013 10:05 AM Injection made incrementally with aspirations every 5 mL.  Performed by: Personally  Anesthesiologist: T Carmine Carrozza  Additional Notes: Monitors applied, sedation begun, tolerated well

## 2013-05-27 NOTE — H&P (Signed)
Mindy Schmitt is an 58 y.o. female.   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 She injured her left knee and was diagnosed with a patella fracture. The fracture showed anterior diastases and it was indicated for surgical treatment to prevent further displacement allowed anatomic healing.  Past Medical History  Diagnosis Date  . Medical history non-contributory   . Headache(784.0)   . Kidney stone on right side     Past Surgical History  Procedure Laterality Date  . Wrist fracture surgery Left   . Vaginal hysterectomy N/A 09/07/2012    Procedure: HYSTERECTOMY VAGINAL;  Surgeon: Olga Millers, MD;  Location: [facility];  Service: Gynecology;  Laterality: N/A;  . Anterior and posterior repair N/A 09/07/2012    Procedure: ANTERIOR (CYSTOCELE) AND POSTERIOR REPAIR (RECTOCELE);  Surgeon: Olga Millers, MD;  Location: [facility];  Service: Gynecology;  Laterality: N/A;  . Bladder suspension N/A 09/07/2012    Procedure: TRANSVAGINAL TAPE (TVT) PROCEDURE with CYSTO;  Surgeon: Olga Millers, MD;  Location: [facility];  Service: Gynecology;  Laterality: N/A;  . Abdominal hysterectomy      Family History  Problem Relation Age of Onset  . Cancer Other    Social History:  reports that she has never smoked. She does not have any smokeless tobacco history on file. She reports that she does not drink alcohol or use illicit drugs.  Allergies:  Allergies  Allergen Reactions  . Hydrocodone-Acetaminophen     Itching, goes to bathroom alot  . Tramadol Hcl     itching    Medications Prior to Admission  Medication Sig Dispense Refill  . acetaminophen (TYLENOL) 500 MG tablet Take 1,000 mg by mouth every 6 (six) hours as needed for moderate pain.      Marland Kitchen desvenlafaxine (PRISTIQ) 50 MG 24 hr tablet Take 50 mg by mouth daily.      . Multiple Vitamin (MULTIVITAMIN WITH MINERALS) TABS Take 1 tablet by mouth daily.  30 tablet  0  . ondansetron  (ZOFRAN ODT) 4 MG disintegrating tablet 76m ODT q4 hours prn nausea/vomit  4 tablet  0  . oxyCODONE-acetaminophen (PERCOCET) 5-325 MG per tablet Take 1 tablet by mouth every 6 (six) hours as needed for moderate pain.  30 tablet  0  . zolpidem (AMBIEN) 10 MG tablet Take 5-10 mg by mouth at bedtime as needed for sleep.         Results for orders placed during the hospital encounter of 05/26/13 (from the past 48 hour(s))  CBC     Status: Abnormal   Collection Time    05/26/13  5:55 PM      Result Value Range   WBC 14.9 (*) 4.0 - 10.5 K/uL   RBC 3.95  3.87 - 5.11 MIL/uL   Hemoglobin 11.6 (*) 12.0 - 15.0 g/dL   HCT 37.1  36.0 - 46.0 %   MCV 93.9  78.0 - 100.0 fL   MCH 29.4  26.0 - 34.0 pg   MCHC 31.3  30.0 - 36.0 g/dL   RDW 13.4  11.5 - 15.5 %   Platelets 376  150 - 400 K/uL  COMPREHENSIVE METABOLIC PANEL     Status: Abnormal   Collection Time    05/26/13  5:55 PM      Result Value Range   Sodium 141  137 - 147 mEq/L   Potassium 4.1  3.7 - 5.3 mEq/L   Chloride 101  96 - 112 mEq/L   CO2 28  19 - 32 mEq/L   Glucose, Bld 133 (*) 70 - 99 mg/dL   BUN 24 (*) 6 - 23 mg/dL   Creatinine, Ser 1.00  0.50 - 1.10 mg/dL   Calcium 9.9  8.4 - 10.5 mg/dL   Total Protein 7.7  6.0 - 8.3 g/dL   Albumin 4.0  3.5 - 5.2 g/dL   AST 36  0 - 37 U/L   ALT 39 (*) 0 - 35 U/L   Alkaline Phosphatase 105  39 - 117 U/L   Total Bilirubin <0.2 (*) 0.3 - 1.2 mg/dL   GFR calc non Af Amer 61 (*) >90 mL/min   GFR calc Af Amer 71 (*) >90 mL/min   Comment: (NOTE)     The eGFR has been calculated using the CKD EPI equation.     This calculation has not been validated in all clinical situations.     eGFR's persistently <90 mL/min signify possible Chronic Kidney     Disease.  LIPASE, BLOOD     Status: None   Collection Time    05/26/13  5:55 PM      Result Value Range   Lipase 59  11 - 59 U/L  URINALYSIS, ROUTINE W REFLEX MICROSCOPIC     Status: Abnormal   Collection Time    05/26/13  8:01 PM      Result Value  Range   Color, Urine YELLOW  YELLOW   APPearance CLOUDY (*) CLEAR   Specific Gravity, Urine 1.029  1.005 - 1.030   pH 7.0  5.0 - 8.0   Glucose, UA NEGATIVE  NEGATIVE mg/dL   Hgb urine dipstick MODERATE (*) NEGATIVE   Bilirubin Urine NEGATIVE  NEGATIVE   Ketones, ur NEGATIVE  NEGATIVE mg/dL   Protein, ur NEGATIVE  NEGATIVE mg/dL   Urobilinogen, UA 0.2  0.0 - 1.0 mg/dL   Nitrite NEGATIVE  NEGATIVE   Leukocytes, UA NEGATIVE  NEGATIVE  URINE MICROSCOPIC-ADD ON     Status: None   Collection Time    05/26/13  8:01 PM      Result Value Range   Squamous Epithelial / LPF RARE  RARE   RBC / HPF 21-50  <3 RBC/hpf   Ct Abdomen Pelvis W Contrast  05/26/2013   CLINICAL DATA:  Right flank pain extending into the right lower quadrant, with nausea and vomiting.  EXAM: CT ABDOMEN AND PELVIS WITH CONTRAST  TECHNIQUE: Multidetector CT imaging of the abdomen and pelvis was performed using the standard protocol following bolus administration of intravenous contrast.  CONTRAST:  142m OMNIPAQUE IOHEXOL 300 MG/ML  SOLN  COMPARISON:  None.  FINDINGS: Lingular scarring versus subsegmental atelectasis. Small hiatal hernia with contrast in the distal esophagus suggesting gastroesophageal reflux.  The liver, spleen, pancreas, and adrenal glands appear unremarkable. No specific gallbladder or biliary abnormality identified. Delayed right renal enhancement with right hydronephrosis and hydroureter extending down to a 1.2 x 0.6 x 0.7 cm bilobed stone or a cluster of 2 calculi in the right distal ureter just past the iliac vessel cross over. No significant right hydroureter distal to this point. No additional ureteral calculi or bladder calculus. No left-sided calculus.  Uterus absent. No significant adnexal lesion. Prominent cecum is likely incidental. High density in a tubular right pelvic structure thought to represent appendix, possibly contrast medium or appendicoliths.  Low position of anorectal angle with respect to the  pubococcygeal line could reflect pelvic floor laxity.  IMPRESSION:  1. Obstructive 1.2 x 0.6 x 0.7 cm stone in the right ureter just distal to the iliac vessel cross over, causing delayed right nephrogram, hydronephrosis, and hydroureter. 2. 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 3. Small hiatal hernia with contrast in the distal esophagus suggesting gastroesophageal reflux. 4. Lingular scarring versus subsegmental atelectasis.   Electronically Signed   By: Sherryl Barters M.D.   On: 05/26/2013 20:09    Review of Systems  Gastrointestinal: Positive for abdominal pain.  All other systems reviewed and are negative.    Blood pressure 132/83, pulse 73, temperature 98.8 F (37.1 C), resp. rate 18, SpO2 97.00%. Physical Exam  Constitutional: She is oriented to person, place, and time. She appears well-developed and well-nourished.  HENT:  Head: Atraumatic.  Eyes: EOM are normal.  Cardiovascular: Intact distal pulses.   Respiratory: Effort normal.  Musculoskeletal:  L knee TTP over patella, cannot perform straight leg raise.  Neurological: She is alert and oriented to person, place, and time.  Skin: Skin is warm and dry.  Psychiatric: She has a normal mood and affect.     Assessment/Plan Left displaced patella fracture Plan open reduction internal fixation Risks / benefits of surgery discussed Consent on chart  NPO for OR Preop antibiotics   Anthany Thornhill WILLIAM 05/27/2013, 9:47 AM

## 2013-05-28 ENCOUNTER — Other Ambulatory Visit: Payer: Self-pay | Admitting: Urology

## 2013-05-28 NOTE — Addendum Note (Signed)
Addendum created 05/28/13 1138 by Jerilee HohValerie N Mumm, CRNA   Modules edited: Anesthesia Events

## 2013-05-31 ENCOUNTER — Encounter (HOSPITAL_COMMUNITY): Payer: Self-pay | Admitting: Orthopedic Surgery

## 2013-06-07 ENCOUNTER — Encounter (HOSPITAL_BASED_OUTPATIENT_CLINIC_OR_DEPARTMENT_OTHER): Payer: Self-pay | Admitting: *Deleted

## 2013-06-10 ENCOUNTER — Encounter (HOSPITAL_BASED_OUTPATIENT_CLINIC_OR_DEPARTMENT_OTHER): Payer: Self-pay | Admitting: *Deleted

## 2013-06-10 NOTE — Progress Notes (Signed)
NPO AFTER MN WITH EXCEPTION CLEAR LIQUIDS UNTIL 0900. ARRIVE AT 1330. NEEDS HG. WILL TAKE PRISTIQ AM DOS W/ SIPS OF WATER AND IF NEEDED PRN MEDS.

## 2013-06-15 ENCOUNTER — Encounter (HOSPITAL_BASED_OUTPATIENT_CLINIC_OR_DEPARTMENT_OTHER): Payer: Self-pay | Admitting: *Deleted

## 2013-06-15 ENCOUNTER — Encounter (HOSPITAL_BASED_OUTPATIENT_CLINIC_OR_DEPARTMENT_OTHER)
Admission: RE | Disposition: A | Discharge status: Home / Self Care | Payer: Self-pay | Source: Ambulatory Visit | Attending: Urology

## 2013-06-15 ENCOUNTER — Ambulatory Visit (HOSPITAL_BASED_OUTPATIENT_CLINIC_OR_DEPARTMENT_OTHER): Payer: BC Managed Care – PPO | Admitting: Certified Registered"

## 2013-06-15 ENCOUNTER — Ambulatory Visit (HOSPITAL_BASED_OUTPATIENT_CLINIC_OR_DEPARTMENT_OTHER)
Admission: RE | Admit: 2013-06-15 | Discharge: 2013-06-15 | Disposition: A | Discharge status: Home / Self Care | Payer: BC Managed Care – PPO | Source: Ambulatory Visit | Attending: Urology | Admitting: Urology

## 2013-06-15 ENCOUNTER — Encounter (HOSPITAL_BASED_OUTPATIENT_CLINIC_OR_DEPARTMENT_OTHER): Payer: BC Managed Care – PPO | Admitting: Certified Registered"

## 2013-06-15 DIAGNOSIS — N201 Calculus of ureter: Secondary | ICD-10-CM | POA: Insufficient documentation

## 2013-06-15 DIAGNOSIS — Z90710 Acquired absence of both cervix and uterus: Secondary | ICD-10-CM | POA: Insufficient documentation

## 2013-06-15 DIAGNOSIS — N1330 Unspecified hydronephrosis: Secondary | ICD-10-CM | POA: Insufficient documentation

## 2013-06-15 DIAGNOSIS — Z885 Allergy status to narcotic agent status: Secondary | ICD-10-CM | POA: Insufficient documentation

## 2013-06-15 HISTORY — DX: Calculus of ureter: N20.1

## 2013-06-15 HISTORY — DX: Nocturia: R35.1

## 2013-06-15 HISTORY — PX: CYSTOSCOPY WITH RETROGRADE PYELOGRAM, URETEROSCOPY AND STENT PLACEMENT: SHX5789

## 2013-06-15 HISTORY — DX: Urgency of urination: R39.15

## 2013-06-15 HISTORY — PX: HOLMIUM LASER APPLICATION: SHX5852

## 2013-06-15 HISTORY — DX: Unspecified fracture of left patella, initial encounter for closed fracture: S82.002A

## 2013-06-15 HISTORY — DX: Frequency of micturition: R35.0

## 2013-06-15 HISTORY — DX: Nausea: R11.0

## 2013-06-15 LAB — POCT I-STAT, CHEM 8
BUN: 20 mg/dL (ref 6–23)
CREATININE: 0.9 mg/dL (ref 0.50–1.10)
Calcium, Ion: 1.33 mmol/L — ABNORMAL HIGH (ref 1.12–1.23)
Chloride: 104 mEq/L (ref 96–112)
GLUCOSE: 96 mg/dL (ref 70–99)
HCT: 39 % (ref 36.0–46.0)
Hemoglobin: 13.3 g/dL (ref 12.0–15.0)
POTASSIUM: 4.5 meq/L (ref 3.7–5.3)
SODIUM: 140 meq/L (ref 137–147)
TCO2: 26 mmol/L (ref 0–100)

## 2013-06-15 SURGERY — CYSTOURETEROSCOPY, WITH RETROGRADE PYELOGRAM AND STENT INSERTION
Anesthesia: General | Site: Ureter | Laterality: Right

## 2013-06-15 MED ORDER — DEXAMETHASONE SODIUM PHOSPHATE 4 MG/ML IJ SOLN
INTRAMUSCULAR | Status: DC | PRN
  Administered 2013-06-15: 10 mg via INTRAVENOUS

## 2013-06-15 MED ORDER — LACTATED RINGERS IV SOLN
INTRAVENOUS | Status: DC
  Administered 2013-06-15 (×2): via INTRAVENOUS
  Filled 2013-06-15: qty 1000

## 2013-06-15 MED ORDER — FENTANYL CITRATE 0.05 MG/ML IJ SOLN
25.0000 ug | INTRAMUSCULAR | Status: DC | PRN
  Administered 2013-06-15 (×2): 50 ug via INTRAVENOUS
  Filled 2013-06-15: qty 1

## 2013-06-15 MED ORDER — SODIUM CHLORIDE 0.9 % IR SOLN
Status: DC | PRN
  Administered 2013-06-15: 4000 mL

## 2013-06-15 MED ORDER — GENTAMICIN IN SALINE 1.6-0.9 MG/ML-% IV SOLN
80.0000 mg | INTRAVENOUS | Status: DC
  Filled 2013-06-15: qty 50

## 2013-06-15 MED ORDER — LACTATED RINGERS IV SOLN
INTRAVENOUS | Status: DC
  Filled 2013-06-15: qty 1000

## 2013-06-15 MED ORDER — SULFAMETHOXAZOLE-TMP DS 800-160 MG PO TABS: 1.0000 | ORAL_TABLET | Freq: Two times a day (BID) | ORAL | Status: AC

## 2013-06-15 MED ORDER — FENTANYL CITRATE 0.05 MG/ML IJ SOLN
INTRAMUSCULAR | Status: AC
  Filled 2013-06-15: qty 2

## 2013-06-15 MED ORDER — MIDAZOLAM HCL 5 MG/5ML IJ SOLN
INTRAMUSCULAR | Status: DC | PRN
  Administered 2013-06-15: 2 mg via INTRAVENOUS

## 2013-06-15 MED ORDER — ONDANSETRON HCL 4 MG/2ML IJ SOLN
INTRAMUSCULAR | Status: DC | PRN
  Administered 2013-06-15: 4 mg via INTRAVENOUS

## 2013-06-15 MED ORDER — MIDAZOLAM HCL 2 MG/2ML IJ SOLN
INTRAMUSCULAR | Status: AC
  Filled 2013-06-15: qty 2

## 2013-06-15 MED ORDER — GENTAMICIN SULFATE 40 MG/ML IJ SOLN
5.0000 mg/kg | Freq: Once | INTRAVENOUS | Status: AC
  Administered 2013-06-15: 310 mg via INTRAVENOUS
  Filled 2013-06-15: qty 7.75

## 2013-06-15 MED ORDER — PROPOFOL 10 MG/ML IV BOLUS
INTRAVENOUS | Status: DC | PRN
  Administered 2013-06-15: 180 mg via INTRAVENOUS

## 2013-06-15 MED ORDER — SENNOSIDES-DOCUSATE SODIUM 8.6-50 MG PO TABS: 1.0000 | ORAL_TABLET | Freq: Two times a day (BID) | ORAL | Status: AC

## 2013-06-15 MED ORDER — LIDOCAINE HCL (CARDIAC) 20 MG/ML IV SOLN
INTRAVENOUS | Status: DC | PRN
  Administered 2013-06-15: 60 mg via INTRAVENOUS

## 2013-06-15 MED ORDER — FENTANYL CITRATE 0.05 MG/ML IJ SOLN
INTRAMUSCULAR | Status: DC | PRN
  Administered 2013-06-15 (×2): 50 ug via INTRAVENOUS

## 2013-06-15 MED ORDER — OXYBUTYNIN CHLORIDE ER 10 MG PO TB24: 10.0000 mg | ORAL_TABLET | Freq: Every day | ORAL | Status: AC | PRN

## 2013-06-15 MED ORDER — OXYCODONE-ACETAMINOPHEN 5-325 MG PO TABS: 1.0000 | ORAL_TABLET | Freq: Four times a day (QID) | ORAL | Status: AC | PRN

## 2013-06-15 MED ORDER — IOHEXOL 350 MG/ML SOLN
INTRAVENOUS | Status: DC | PRN
  Administered 2013-06-15: 10 mL

## 2013-06-15 SURGICAL SUPPLY — 40 items
BAG DRAIN URO-CYSTO SKYTR STRL (DRAIN) ×2 IMPLANT
BAG DRN UROCATH (DRAIN) ×1
BASKET LASER NITINOL 1.9FR (BASKET) ×2 IMPLANT
BASKET STNLS GEMINI 4WIRE 3FR (BASKET) IMPLANT
BASKET ZERO TIP NITINOL 2.4FR (BASKET) IMPLANT
BRUSH URET BIOPSY 3F (UROLOGICAL SUPPLIES) IMPLANT
BSKT STON RTRVL 120 1.9FR (BASKET) ×1
BSKT STON RTRVL GEM 120X11 3FR (BASKET)
BSKT STON RTRVL ZERO TP 2.4FR (BASKET)
CANISTER SUCT LVC 12 LTR MEDI- (MISCELLANEOUS) ×2 IMPLANT
CATH INTERMIT  6FR 70CM (CATHETERS) ×2 IMPLANT
CATH URET 5FR 28IN CONE TIP (BALLOONS)
CATH URET 5FR 28IN OPEN ENDED (CATHETERS) IMPLANT
CATH URET 5FR 70CM CONE TIP (BALLOONS) IMPLANT
CLOTH BEACON ORANGE TIMEOUT ST (SAFETY) ×2 IMPLANT
DRAPE CAMERA CLOSED 9X96 (DRAPES) ×2 IMPLANT
ELECT REM PT RETURN 9FT ADLT (ELECTROSURGICAL)
ELECTRODE REM PT RTRN 9FT ADLT (ELECTROSURGICAL) IMPLANT
EXTRACTOR STONE NITINOL NGAGE (UROLOGICAL SUPPLIES) ×1 IMPLANT
FIBER LASER FLEXIVA 200 (UROLOGICAL SUPPLIES) IMPLANT
FIBER LASER FLEXIVA 365 (UROLOGICAL SUPPLIES) IMPLANT
FIBER LASER TRAC TIP (UROLOGICAL SUPPLIES) ×1 IMPLANT
GLOVE BIO SURGEON STRL SZ7.5 (GLOVE) ×2 IMPLANT
GOWN PREVENTION PLUS LG XLONG (DISPOSABLE) ×2 IMPLANT
GOWN STRL REIN XL XLG (GOWN DISPOSABLE) ×1 IMPLANT
GOWN STRL REUS W/ TWL LRG LVL3 (GOWN DISPOSABLE) IMPLANT
GOWN STRL REUS W/TWL LRG LVL3 (GOWN DISPOSABLE) ×2
GOWN STRL REUS W/TWL XL LVL3 (GOWN DISPOSABLE) ×1 IMPLANT
GUIDEWIRE 0.038 PTFE COATED (WIRE) IMPLANT
GUIDEWIRE ANG ZIPWIRE 038X150 (WIRE) ×2 IMPLANT
GUIDEWIRE STR DUAL SENSOR (WIRE) ×2 IMPLANT
IV NS 1000ML (IV SOLUTION) ×14
IV NS 1000ML BAXH (IV SOLUTION) IMPLANT
PACK CYSTOSCOPY (CUSTOM PROCEDURE TRAY) ×2 IMPLANT
SHEATH URET ACCESS 12FR/35CM (UROLOGICAL SUPPLIES) IMPLANT
SHEATH URET ACCESS 12FR/55CM (UROLOGICAL SUPPLIES) IMPLANT
STENT POLARIS 5FRX24 (STENTS) ×1 IMPLANT
SYRINGE 10CC LL (SYRINGE) ×2 IMPLANT
SYRINGE IRR TOOMEY STRL 70CC (SYRINGE) IMPLANT
TUBE FEEDING 8FR 16IN STR KANG (MISCELLANEOUS) ×1 IMPLANT

## 2013-06-15 NOTE — Transfer of Care (Signed)
Immediate Anesthesia Transfer of Care Note  Patient: Mindy Schmitt  Procedure(s) Performed: Procedure(s) (LRB): CYSTOSCOPY WITH RETROGRADE PYELOGRAM, URETEROSCOPY AND STENT PLACEMENT (Right) HOLMIUM LASER APPLICATION (Right)  Patient Location: PACU  Anesthesia Type: General  Level of Consciousness: awake, oriented, sedated and patient cooperative  Airway & Oxygen Therapy: Patient Spontanous Breathing and Patient connected to face mask oxygen  Post-op Assessment: Report given to PACU RN and Post -op Vital signs reviewed and stable  Post vital signs: Reviewed and stable  Complications: No apparent anesthesia complications

## 2013-06-15 NOTE — Brief Op Note (Signed)
06/15/2013  2:09 PM  PATIENT:  Mindy Schmitt  58 y.o. female  PRE-OPERATIVE DIAGNOSIS:  LARGE RIGHT URETERAL STONE  POST-OPERATIVE DIAGNOSIS:  Large Right Ureteral Stone  PROCEDURE:  Procedure(s): CYSTOSCOPY WITH RETROGRADE PYELOGRAM, URETEROSCOPY AND STENT PLACEMENT (Right) HOLMIUM LASER APPLICATION (Right)  SURGEON:  Surgeon(s) and Role:    * Sebastian Acheheodore Jorgeluis Gurganus, MD - Primary  PHYSICIAN ASSISTANT:   ASSISTANTS: none   ANESTHESIA:   general  EBL:  Total I/O In: 1000 [I.V.:1000] Out: -   BLOOD ADMINISTERED:none  DRAINS: none   LOCAL MEDICATIONS USED:  NONE  SPECIMEN:  Source of Specimen:  Rt Ureteral Stone Fragments for compositional analysis  DISPOSITION OF SPECIMEN:  [facility] for Compositional analysis  COUNTS:  YES  TOURNIQUET:  * No tourniquets in log *  DICTATION: .Other Dictation: Dictation Number [id number]  PLAN OF CARE: Discharge to home after PACU  PATIENT DISPOSITION:  PACU - hemodynamically stable.   Delay start of Pharmacological VTE agent (>24hrs) due to surgical blood loss or risk of bleeding: not applicable

## 2013-06-15 NOTE — Anesthesia Postprocedure Evaluation (Signed)
  Anesthesia Post-op Note  Patient: Mindy Schmitt  Procedure(s) Performed: Procedure(s) (LRB): CYSTOSCOPY WITH RETROGRADE PYELOGRAM, URETEROSCOPY AND STENT PLACEMENT (Right) HOLMIUM LASER APPLICATION (Right)  Patient Location: PACU  Anesthesia Type: General  Level of Consciousness: awake and alert   Airway and Oxygen Therapy: Patient Spontanous Breathing  Post-op Pain: mild  Post-op Assessment: Post-op Vital signs reviewed, Patient's Cardiovascular Status Stable, Respiratory Function Stable, Patent Airway and No signs of Nausea or vomiting  Last Vitals:  Filed Vitals:   06/15/13 1417  BP: 145/85  Pulse:   Temp: 36.2 C  Resp: 18    Post-op Vital Signs: stable   Complications: No apparent anesthesia complications

## 2013-06-15 NOTE — Anesthesia Preprocedure Evaluation (Signed)
Anesthesia Evaluation  Patient identified by MRN, date of birth, ID band Patient awake    Reviewed: Allergy & Precautions, H&P , NPO status , Patient's Chart, lab work & pertinent test results  Airway Mallampati: II  TM Distance: >3 FB Neck ROM: full    Dental no notable dental hx. (+) Teeth Intact, Dental Advisory Given   Pulmonary neg pulmonary ROS,  breath sounds clear to auscultation  Pulmonary exam normal       Cardiovascular Exercise Tolerance: Good negative cardio ROS  Rhythm:regular Rate:Normal     Neuro/Psych negative neurological ROS  negative psych ROS   GI/Hepatic negative GI ROS, Neg liver ROS,   Endo/Other  negative endocrine ROS  Renal/GU negative Renal ROS  negative genitourinary   Musculoskeletal   Abdominal   Peds  Hematology negative hematology ROS (+)   Anesthesia Other Findings   Reproductive/Obstetrics negative OB ROS                             Anesthesia Physical Anesthesia Plan  ASA: I  Anesthesia Plan: General   Post-op Pain Management:    Induction: Intravenous  Airway Management Planned: LMA  Additional Equipment:   Intra-op Plan:   Post-operative Plan:   Informed Consent: I have reviewed the patients History and Physical, chart, labs and discussed the procedure including the risks, benefits and alternatives for the proposed anesthesia with the patient or authorized representative who has indicated his/her understanding and acceptance.   Dental Advisory Given  Plan Discussed with: CRNA and Surgeon  Anesthesia Plan Comments:         Anesthesia Quick Evaluation  

## 2013-06-15 NOTE — Discharge Instructions (Signed)
1 - You may have urinary urgency (bladder spasms) and bloody urine on / off with stent in place. This is normal. ° °2 - Call MD or go to ER for fever >102, severe pain / nausea / vomiting not relieved by medications, or acute change in medical status ° °Post Anesthesia Home Care Instructions ° °Activity: °Get plenty of rest for the remainder of the day. A responsible adult should stay with you for 24 hours following the procedure.  °For the next 24 hours, DO NOT: °-Drive a car °-Operate machinery °-Drink alcoholic beverages °-Take any medication unless instructed by your physician °-Make any legal decisions or sign important papers. ° °Meals: °Start with liquid foods such as gelatin or soup. Progress to regular foods as tolerated. Avoid greasy, spicy, heavy foods. If nausea and/or vomiting occur, drink only clear liquids until the nausea and/or vomiting subsides. Call your physician if vomiting continues. ° °Special Instructions/Symptoms: °Your throat may feel dry or sore from the anesthesia or the breathing tube placed in your throat during surgery. If this causes discomfort, gargle with warm salt water. The discomfort should disappear within 24 hours. °[facility] °[phone] °Post Ureteroscopy With or Without Stent Instructions ° °Definitions: ° °Ureter: The duct that transports urine from the kidney to the bladder. °Stent:   A plastic hollow tube that is placed into the ureter, from the kidney to the                 bladder to prevent the ureter from swelling shut. ° °GENERAL INSTRUCTIONS: ° °Despite the fact that no skin incisions were used, the area around the ureter and bladder is raw and irritated. The stent is a foreign body which will further irritate the bladder wall. This irritation is manifested by increased frequency of urination, both day and night, and by an increase in the urge to urinate. In some, the urge to urinate is present almost always. Sometimes the urge is strong enough  that you may not be able to stop yourself from urinating. The only real cure is to remove the stent and then give time for the bladder wall to heal which can't be done until the danger of the ureter swelling shut has passed, which varies. ° °You may see some blood in your urine while the stent is in place and a few days afterwards. Do not be alarmed, even if the urine was clear for a while. Get off your feet and drink lots of fluids until clearing occurs. If you start to pass clots or don't improve, call us. ° °DIET: °You may return to your normal diet immediately. Because of the raw surface of your bladder, alcohol, spicy foods, acid type foods and drinks with caffeine may cause irritation or frequency and should be used in moderation. To keep your urine flowing freely and to avoid constipation, drink plenty of fluids during the day ( 8-10 glasses ). °Tip: Avoid cranberry juice because it is very acidic. ° °ACTIVITY: °Your physical activity doesn't need to be restricted. However, if you are very active, you may see some blood in your urine. We suggest that you reduce your activity under these circumstances until the bleeding has stopped. ° °BOWELS: °It is important to keep your bowels regular during the postoperative period. Straining with bowel movements can cause bleeding. A bowel movement every other day is reasonable. Use a mild laxative if needed, such as Milk of Magnesia 2-3 tablespoons, or 2 Dulcolax tablets. Call if you continue to   have problems. If you have been taking narcotics for pain, before, during or after your surgery, you may be constipated. Take a laxative if necessary. ° ° °MEDICATION: °You should resume your pre-surgery medications unless told not to. In addition you will often be given an antibiotic to prevent infection. These should be taken as prescribed until the bottles are finished unless you are having an unusual reaction to one of the drugs. ° °PROBLEMS YOU SHOULD REPORT TO US: °· Fevers  over 100.5 Fahrenheit. °· Heavy bleeding, or clots ( See above notes about blood in urine ). °· Inability to urinate. °· Drug reactions ( hives, rash, nausea, vomiting, diarrhea ). °· Severe burning or pain with urination that is not improving. ° °FOLLOW-UP: °You will need a follow-up appointment to monitor your progress. Call for this appointment at the number listed above. Usually the first appointment will be about three to fourteen days after your surgery. ° ° ° ° ° °

## 2013-06-15 NOTE — H&P (Signed)
Mindy Schmitt is an 58 y.o. female.    Chief Complaint: Pre-OP Rt Ureteroscopic Stone Manipulation  HPI:   1 - Large Rt Distal Ureteral Stone - 1.2 cm rt distal ureteral stone close to iliacs with mod-severe prox hydro, delayed nephrogram by CT 2/20115. 1200HU, no additional stones. No prior stones. Presently with minimal discomfort, only dull ache on right pelvis.   PMH sig for left patella RX s/p ORIF 05/27/13, vag hyst and POP repair,   Today Mindy Schmitt is seen to proceed with right ureteroscopic stone manipulation. Most recent UCX negative.   Past Medical History  Diagnosis Date  . Right ureteral stone   . Headache(784.0)   . Left patella fracture     s/p orif  05-27-2013  . Nausea   . Frequency of urination   . Urgency of urination   . Nocturia     Past Surgical History  Procedure Laterality Date  . Vaginal hysterectomy N/A 09/07/2012    Procedure: HYSTERECTOMY VAGINAL;  Surgeon: Levi Aland, MD;  Location: [facility];  Service: Gynecology;  Laterality: N/A;  . Anterior and posterior repair N/A 09/07/2012    Procedure: ANTERIOR (CYSTOCELE) AND POSTERIOR REPAIR (RECTOCELE);  Surgeon: Levi Aland, MD;  Location: [facility];  Service: Gynecology;  Laterality: N/A;  . Bladder suspension N/A 09/07/2012    Procedure: TRANSVAGINAL TAPE (TVT) PROCEDURE with CYSTO;  Surgeon: Levi Aland, MD;  Location: [facility];  Service: Gynecology;  Laterality: N/A;  . Orif patella Left 05/27/2013    Procedure: OPEN REDUCTION INTERNAL (ORIF) FIXATION PATELLA;  Surgeon: Mable Paris, MD;  Location[facility];  Service: Orthopedics;  Laterality: Left;  Open reduction internal fixation left patella fracture  . Orif left wrist fx  2013    Family History  Problem Relation Age of Onset  . Cancer Other    Social History:  reports that she has never smoked. She does not have any smokeless tobacco history on file. She reports that she does not drink alcohol or use illicit drugs.  Allergies:   Allergies  Allergen Reactions  . Hydrocodone-Acetaminophen Itching  . Tramadol Hcl Itching    No prescriptions prior to admission    No results found for this or any previous visit (from the past 48 hour(s)). No results found.  Review of Systems  Constitutional: Negative.  Negative for fever, chills and malaise/fatigue.  HENT: Negative.   Eyes: Negative.   Respiratory: Negative.   Cardiovascular: Negative.   Gastrointestinal: Negative.   Genitourinary: Negative.   Musculoskeletal: Negative.   Skin: Negative.   Neurological: Negative.   Endo/Heme/Allergies: Negative.   Psychiatric/Behavioral: Negative.     Weight 61.236 kg (135 lb). Physical Exam  Constitutional: She is oriented to person, place, and time. She appears well-developed and well-nourished.  HENT:  Head: Normocephalic and atraumatic.  Eyes: EOM are normal. Pupils are equal, round, and reactive to light.  Neck: Normal range of motion. Neck supple.  Cardiovascular: Normal rate and regular rhythm.   Respiratory: Effort normal and breath sounds normal.  GI: Soft. Bowel sounds are normal.  Genitourinary:  No CVAT  Musculoskeletal: Normal range of motion.  LLE boot  Neurological: She is alert and oriented to person, place, and time.  Skin: Skin is warm and dry.  Psychiatric: She has a normal mood and affect. Her behavior is normal. Judgment and thought content normal.     Assessment/Plan  1 - Large Rt Distal Ureteral Stone - very large stone, likely chronic in  nature given amount of hydro and some parenchymal changes. Kidney definatnely at risk for demise untreated. Prefer URS given size / density. May even require staged approach. She has now been cleared for knee flexion per her orthopaedist.  We rediscussed ureteroscopic stone manipulation with basketing and laser-lithotripsy in detail.  We rediscussed risks including bleeding, infection, damage to kidney / ureter  bladder, rarely loss of kidney. We  rediscussed anesthetic risks and rare but serious surgical complications including DVT, PE, MI, and mortality. We specifically readdressed that in 5-10% of cases a staged approach is required with stenting followed by re-attempt ureteroscopy if anatomy unfavorable. The patient voiced understanding and wises to proceed.    Mindy Schmitt 06/15/2013, 6:15 AM

## 2013-06-15 NOTE — Anesthesia Procedure Notes (Signed)
Procedure Name: LMA Insertion Date/Time: 06/15/2013 12:40 PM Performed by: Renella CunasHAZEL, Tanaysia Bhardwaj D Pre-anesthesia Checklist: Patient identified, Emergency Drugs available, Suction available and Patient being monitored Patient Re-evaluated:Patient Re-evaluated prior to inductionOxygen Delivery Method: Circle System Utilized Preoxygenation: Pre-oxygenation with 100% oxygen Intubation Type: IV induction Ventilation: Mask ventilation without difficulty LMA: LMA inserted LMA Size: 4.0 Number of attempts: 1 Airway Equipment and Method: bite block Placement Confirmation: positive ETCO2 Tube secured with: Tape Dental Injury: Teeth and Oropharynx as per pre-operative assessment

## 2013-06-16 NOTE — Op Note (Signed)
NAMDella Goo:  Schmitt, Mindy              ACCOUNT NO.:  [id number]  MEDICAL RECORD NO.:  [id number]  LOCATION:                                 FACILITY:  PHYSICIAN:  Sebastian Acheheodore Sarissa Dern, MD     DATE OF BIRTH:  1956-02-07  DATE OF PROCEDURE: 06/15/2013 DATE OF DISCHARGE:                              OPERATIVE REPORT   DIAGNOSIS:  Large right distal ureteral stone with right hydroureteronephrosis.  PROCEDURES: 1. Cystoscopy with right retrograde pyelogram interpretation. 2. Insertion of right ureteral stent, 5 x 24, no tether. 3. Right ureteroscopy with laser lithotripsy.  FINDINGS: 1. Very large impacted right distal ureteral stone at the level of the     crossing of the iliacs. 2. Significant ureteral dilation proximal to the stone. 3. Unremarkable urinary bladder.  ESTIMATED BLOOD LOSS:  Nil.  COMPLICATIONS:  None.  SPECIMENS:  Right ureteral and renal stone fragments for compositional analysis.  INDICATIONS:  Mindy Schmitt is a pleasant 58 year old lady with unfortunate recent history of left kneecap fracture who was found on unrelated imaging to have a very large right distal ureteral stone and significant right hydroureteronephrosis.  On questioning, she does admit to colicky right-sided abdominal flank pain for some time.  No prior episode of nephrolithiasis.  She does have apparently preserved parenchyma on that side.  Options were discussed including observation versus shockwave lithotripsy versus ureteroscopic stone manipulation, and she wished to proceed with the latter.  Informed consent was obtained and placed in the medical record.  PROCEDURE IN DETAIL:  The patient being, Mindy Schmitt, was verified. Procedure being right ureteroscopic stone manipulation was confirmed. Procedure was carried out.  Time-out was performed.  Intravenous antibiotics were administered.  General LMA anesthesia was introduced. The patient was placed into a low lithotomy position, and sterile  field was created by prepping and draping in the patient's vagina, introitus, and proximal thighs using iodine x3.  Next, cystourethroscopy was performed using a 20-French rigid cystoscope with 12-degree offset lens. Inspection of the urinary bladder revealed no diverticula, calcifications, papular lesions.  The right ureteral orifice was cannulated with a 6-French end-hole catheter and right retrograde pyelogram was obtained.  Right retrograde pyelogram demonstrated a single right ureter with single system right kidney.  There was a large filling defect in distal ureter consistent with known stone.  There was large hydroureteronephrosis above this.  Attempt was made to place the 0.038 Glidewire above the level of the stone, however, it was just coiled. Suggesting impaction of the stone, multiple angulations were attempted and again the wire would not pass the stone.  As such, an 8-French feeding tube was placed in urinary bladder for pressure release and semi- rigid ureteroscopy was performed at the distal right ureter with a Sensor working wire.  The stone in question was found and was visually impacted.  There did appear to be a window next to the stone through which the Sensor wire was carefully navigated up the right ureter and set aside as a safety wire.  Next, holmium laser energy was applied to the stone using settings of 0.5 joules and 5 Hz, and a dusting technique was used, which I was able to control approximately  half the volume of the stone.  The remainder of the stone was fragmented into fragments approximately 2 or 3 mm in size.  These were then grasped with an engage basket and brought out their entirety, set aside for compositional analysis.  Given the large volume of stone, there was significant concern about retrograde displacement of the stone fragments.  As such, the semi-rigid ureteroscope was used to inspect the distal two-thirds of the right ureter.  No fragments  were encountered. Nevertheless, it was felt that inspection of the right kidney was clearly warranted and as such a 0.038 Glidewire was advanced, set aside as a new safety wire and a 24 cm 12/14 ureteral access sheath was placed over the Sensor working wire using continuous fluoroscopic guidance at the level of the proximal ureter.  Next, flexible digital ureteroscopy was performed using 8-French digital ureteroscope.  This allowed inspection of the proximal ureter and systematic inspection of the entire right kidney, and as expected, there were several fragments in the right kidney likely representing retrograde placement of ureteral fragments.  These were additionally grasped in an engaged basket and brought out, and set aside for compositional analysis.  Repeat inspection revealed excellent hemostasis.  No evidence of perforation. All stone fragments larger than 1/3 mm were removed.  The access sheath was removed under continuous ureteroscopic vision and no mucosal abnormalities were found.  Finally, a new 5 x 24 Polaris-type stent was placed with remaining safety wire using cystoscopic and fluoroscopic guidance.  Good proximal and distal deployment were noted.  Bladder was emptied per cystoscope.  Procedure was then terminated.  The patient tolerated the procedure well and there were no immediate periprocedural complications.  The patient was taken to postanesthesia care unit in stable condition.          ______________________________ Sebastian Ache, MD     TM/MEDQ  D:  06/15/2013  T:  06/16/2013  Job:  [id number]

## 2013-06-18 ENCOUNTER — Encounter (HOSPITAL_BASED_OUTPATIENT_CLINIC_OR_DEPARTMENT_OTHER): Payer: Self-pay | Admitting: Urology

## 2013-10-12 ENCOUNTER — Other Ambulatory Visit: Payer: Self-pay | Admitting: Obstetrics and Gynecology

## 2013-10-13 LAB — CYTOLOGY - PAP

## 2013-11-02 ENCOUNTER — Other Ambulatory Visit: Payer: Self-pay | Admitting: Obstetrics and Gynecology

## 2016-05-23 ENCOUNTER — Other Ambulatory Visit: Payer: Self-pay | Admitting: Obstetrics and Gynecology

## 2016-05-23 DIAGNOSIS — R5381 Other malaise: Secondary | ICD-10-CM

## 2016-10-18 ENCOUNTER — Other Ambulatory Visit: Payer: Self-pay | Admitting: Obstetrics and Gynecology

## 2016-10-18 DIAGNOSIS — E2839 Other primary ovarian failure: Secondary | ICD-10-CM

## 2016-10-21 ENCOUNTER — Ambulatory Visit
Admission: RE | Admit: 2016-10-21 | Discharge: 2016-10-21 | Disposition: A | Discharge status: Home / Self Care | Payer: BC Managed Care – PPO | Source: Ambulatory Visit | Attending: Obstetrics and Gynecology | Admitting: Obstetrics and Gynecology

## 2016-10-21 DIAGNOSIS — E2839 Other primary ovarian failure: Secondary | ICD-10-CM

## 2017-01-09 ENCOUNTER — Other Ambulatory Visit: Payer: Self-pay | Admitting: Family Medicine

## 2017-01-09 ENCOUNTER — Ambulatory Visit
Admission: RE | Admit: 2017-01-09 | Discharge: 2017-01-09 | Disposition: A | Discharge status: Home / Self Care | Payer: BC Managed Care – PPO | Source: Ambulatory Visit | Attending: Family Medicine | Admitting: Family Medicine

## 2017-01-09 DIAGNOSIS — Z01818 Encounter for other preprocedural examination: Secondary | ICD-10-CM

## 2018-03-17 IMAGING — CR DG CHEST 2V
2 series · 2 of 2 positions shown · non-contrast
Comparison: 06/26/2007

CLINICAL DATA: Preoperative evaluation for knee replacement.

EXAM:
CHEST  2 VIEW

[w chest pa]
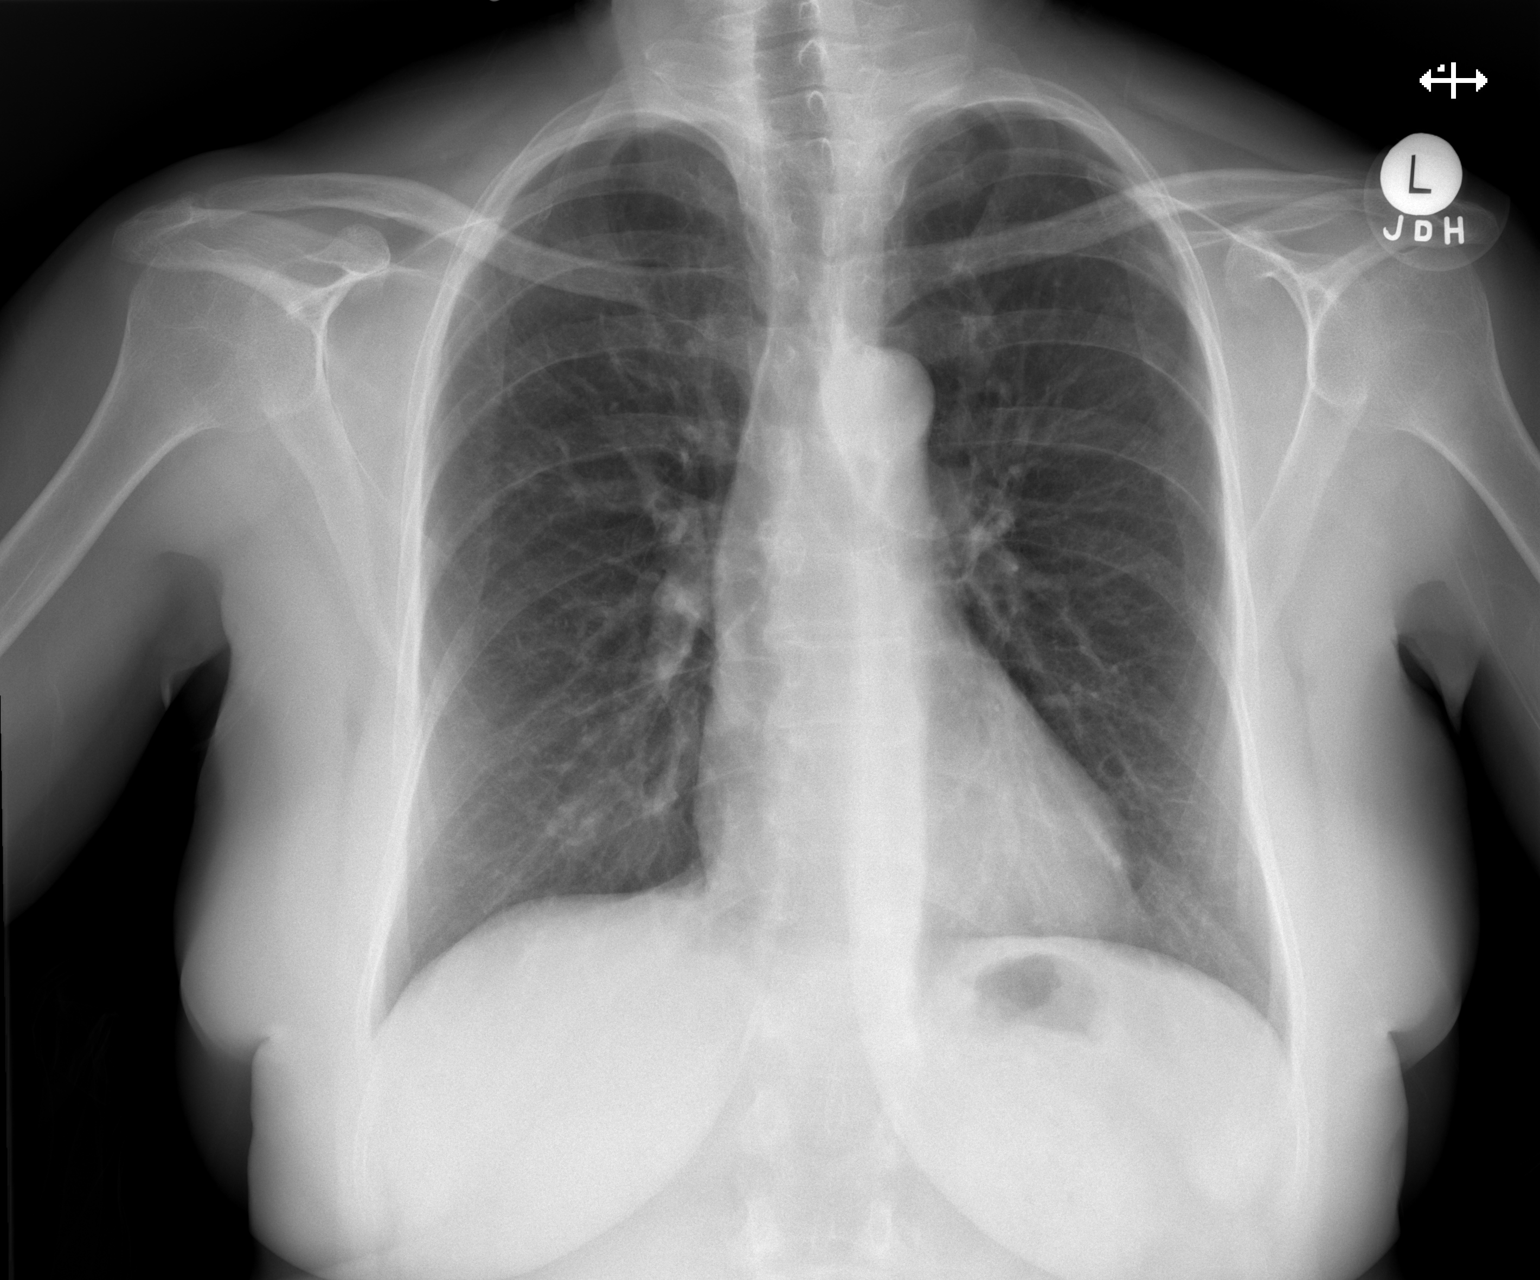

[w chest lat]
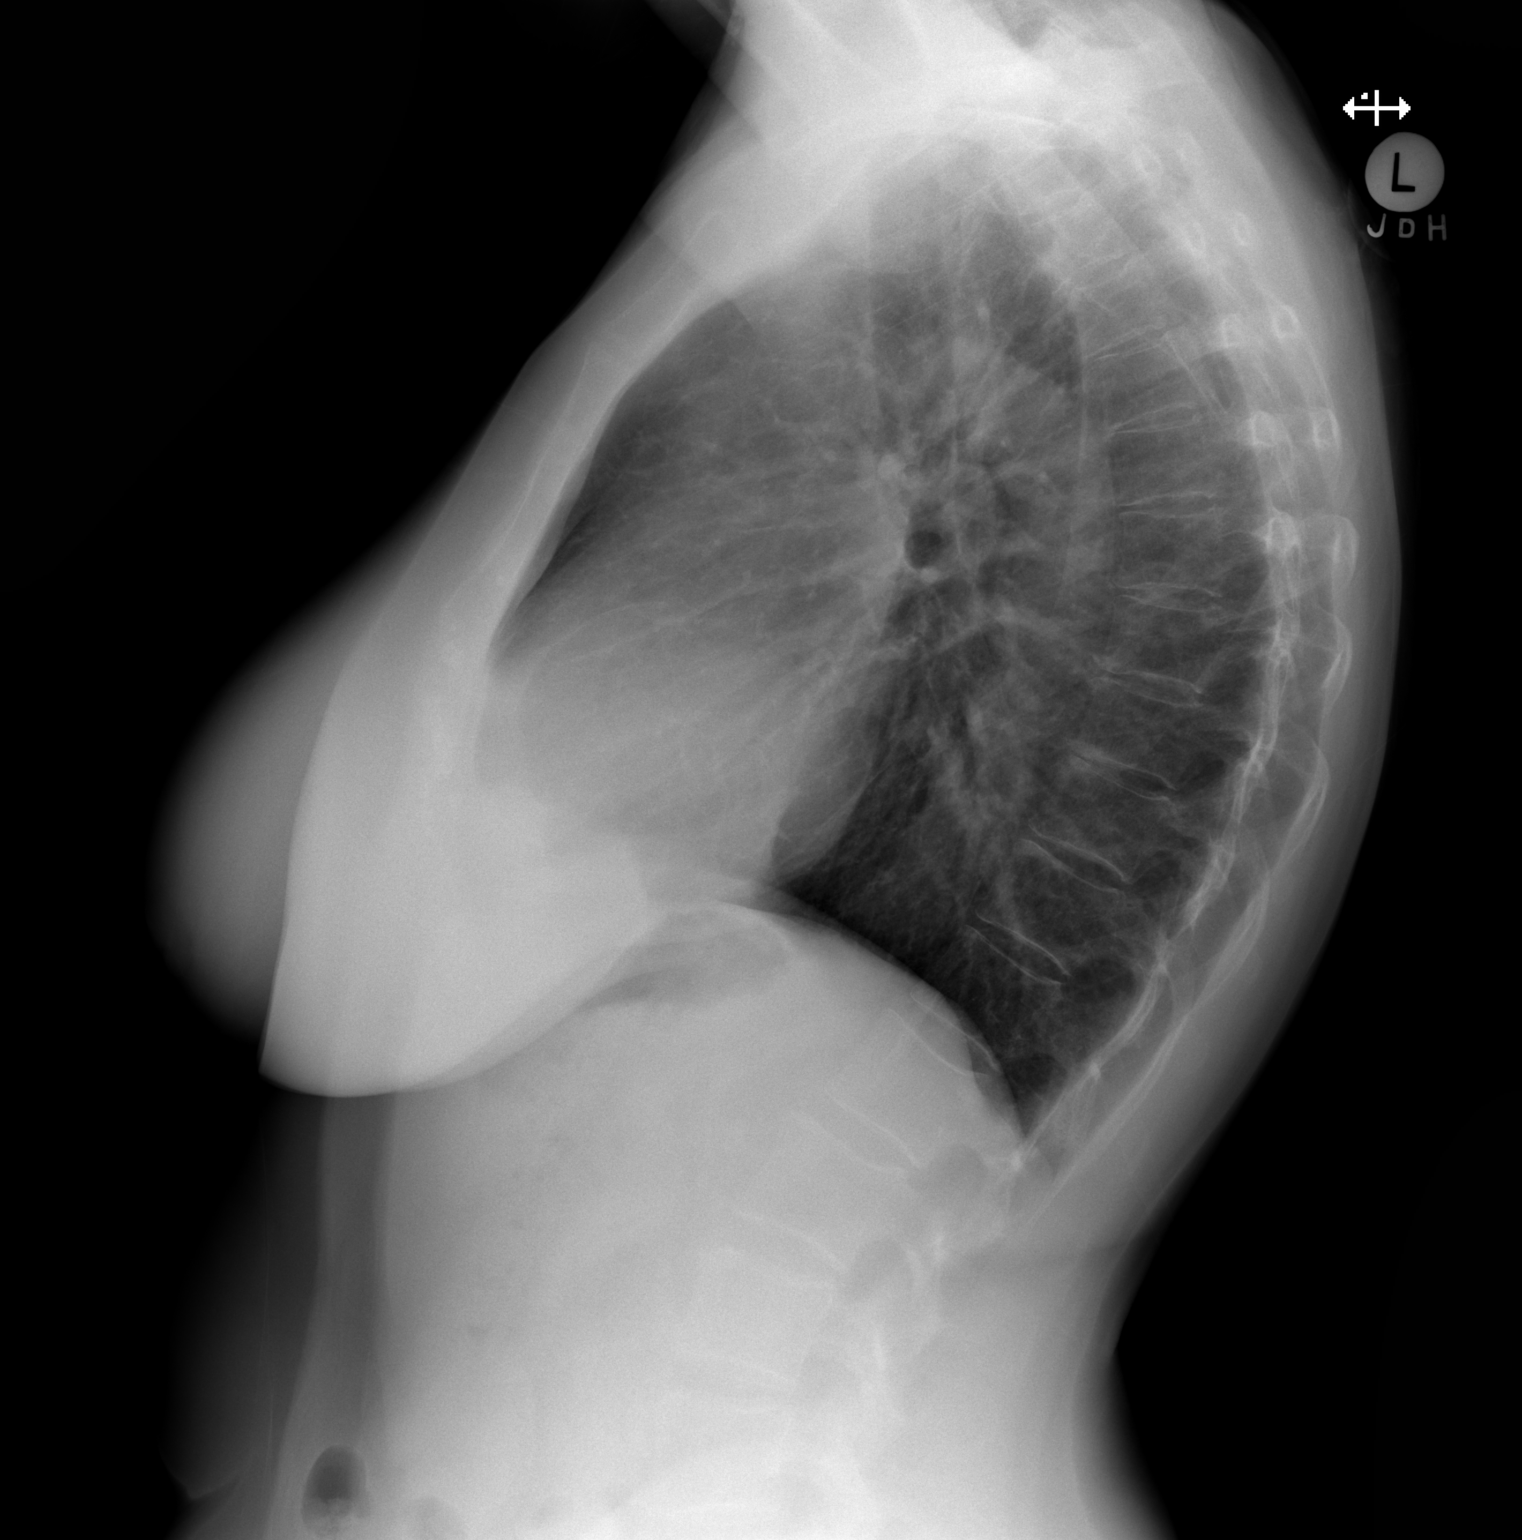

[2 of 2 positions shown; findings below may reference images not displayed]

FINDINGS: Heart size is normal. Mediastinal shadows are normal. Lungs are
clear. The vascularity is normal. No effusions. No bone abnormality.
IMPRESSION: Normal chest

## 2020-05-25 DIAGNOSIS — Z20822 Contact with and (suspected) exposure to covid-19: Secondary | ICD-10-CM | POA: Diagnosis not present

## 2020-06-13 DIAGNOSIS — L909 Atrophic disorder of skin, unspecified: Secondary | ICD-10-CM | POA: Diagnosis not present

## 2020-12-02 DIAGNOSIS — H2513 Age-related nuclear cataract, bilateral: Secondary | ICD-10-CM | POA: Diagnosis not present

## 2020-12-13 DIAGNOSIS — Z79899 Other long term (current) drug therapy: Secondary | ICD-10-CM | POA: Diagnosis not present

## 2020-12-13 DIAGNOSIS — M810 Age-related osteoporosis without current pathological fracture: Secondary | ICD-10-CM | POA: Diagnosis not present

## 2020-12-13 DIAGNOSIS — Z0001 Encounter for general adult medical examination with abnormal findings: Secondary | ICD-10-CM | POA: Diagnosis not present

## 2020-12-13 DIAGNOSIS — F331 Major depressive disorder, recurrent, moderate: Secondary | ICD-10-CM | POA: Diagnosis not present

## 2020-12-13 DIAGNOSIS — Z23 Encounter for immunization: Secondary | ICD-10-CM | POA: Diagnosis not present

## 2020-12-13 DIAGNOSIS — F5101 Primary insomnia: Secondary | ICD-10-CM | POA: Diagnosis not present

## 2021-07-31 DIAGNOSIS — Z1231 Encounter for screening mammogram for malignant neoplasm of breast: Secondary | ICD-10-CM | POA: Diagnosis not present

## 2021-07-31 DIAGNOSIS — Z1272 Encounter for screening for malignant neoplasm of vagina: Secondary | ICD-10-CM | POA: Diagnosis not present

## 2021-07-31 DIAGNOSIS — Z01419 Encounter for gynecological examination (general) (routine) without abnormal findings: Secondary | ICD-10-CM | POA: Diagnosis not present

## 2021-12-21 DIAGNOSIS — Z79899 Other long term (current) drug therapy: Secondary | ICD-10-CM | POA: Diagnosis not present

## 2021-12-21 DIAGNOSIS — F331 Major depressive disorder, recurrent, moderate: Secondary | ICD-10-CM | POA: Diagnosis not present

## 2021-12-27 ENCOUNTER — Other Ambulatory Visit (HOSPITAL_BASED_OUTPATIENT_CLINIC_OR_DEPARTMENT_OTHER): Payer: Self-pay | Admitting: Family Medicine

## 2021-12-27 ENCOUNTER — Other Ambulatory Visit: Payer: Self-pay | Admitting: Family Medicine

## 2021-12-27 DIAGNOSIS — E7800 Pure hypercholesterolemia, unspecified: Secondary | ICD-10-CM | POA: Diagnosis not present

## 2021-12-27 DIAGNOSIS — M810 Age-related osteoporosis without current pathological fracture: Secondary | ICD-10-CM | POA: Diagnosis not present

## 2021-12-27 DIAGNOSIS — F5101 Primary insomnia: Secondary | ICD-10-CM | POA: Diagnosis not present

## 2021-12-27 DIAGNOSIS — Z0001 Encounter for general adult medical examination with abnormal findings: Secondary | ICD-10-CM | POA: Diagnosis not present

## 2021-12-27 DIAGNOSIS — F331 Major depressive disorder, recurrent, moderate: Secondary | ICD-10-CM | POA: Diagnosis not present

## 2022-01-14 DIAGNOSIS — H2513 Age-related nuclear cataract, bilateral: Secondary | ICD-10-CM | POA: Diagnosis not present

## 2022-01-30 ENCOUNTER — Ambulatory Visit (HOSPITAL_BASED_OUTPATIENT_CLINIC_OR_DEPARTMENT_OTHER)
Admission: RE | Admit: 2022-01-30 | Discharge: 2022-01-30 | Disposition: A | Discharge status: Home / Self Care | Payer: BC Managed Care – PPO | Source: Ambulatory Visit | Attending: Family Medicine | Admitting: Family Medicine

## 2022-01-30 ENCOUNTER — Encounter (HOSPITAL_BASED_OUTPATIENT_CLINIC_OR_DEPARTMENT_OTHER): Payer: Self-pay

## 2022-01-30 DIAGNOSIS — E7800 Pure hypercholesterolemia, unspecified: Secondary | ICD-10-CM | POA: Insufficient documentation

## 2022-02-13 DIAGNOSIS — I2510 Atherosclerotic heart disease of native coronary artery without angina pectoris: Secondary | ICD-10-CM | POA: Diagnosis not present

## 2022-02-13 DIAGNOSIS — L299 Pruritus, unspecified: Secondary | ICD-10-CM | POA: Diagnosis not present

## 2022-03-04 NOTE — Progress Notes (Unsigned)
Cardiology Office Note:    Date:  03/07/2022   ID:  Mindy Schmitt, DOB 09/20/1955, MRN [id number]  PCP:  Darrow Bussing, MD   [facility] Providers Cardiologist:  None {   Referring MD: Darrow Bussing, MD    History of Present Illness:    Mindy Schmitt is a 66 y.o. female with a hx of HLD who was referred by Dr. Docia Chuck for further evaluation of elevated Ca score.   Patient was seen by Dr. Docia Chuck on 02/12/22. Note reviewed. Underwent Ca score for screening which showed a score of 434 (93%) now prompting referral to Cardiology.  Today, ***  Past Medical History:  Diagnosis Date   Frequency of urination    Headache(784.0)    Left patella fracture    s/p orif  05-27-2013   Nausea    Nocturia    Right ureteral stone    Urgency of urination     Past Surgical History:  Procedure Laterality Date   ANTERIOR AND POSTERIOR REPAIR N/A 09/07/2012   Procedure: ANTERIOR (CYSTOCELE) AND POSTERIOR REPAIR (RECTOCELE);  Surgeon: Levi Aland, MD;  Location: [facility];  Service: Gynecology;  Laterality: N/A;   BLADDER SUSPENSION N/A 09/07/2012   Procedure: TRANSVAGINAL TAPE (TVT) PROCEDURE with CYSTO;  Surgeon: Levi Aland, MD;  Location: [facility];  Service: Gynecology;  Laterality: N/A;   CYSTOSCOPY WITH RETROGRADE PYELOGRAM, URETEROSCOPY AND STENT PLACEMENT Right 06/15/2013   Procedure: CYSTOSCOPY WITH RETROGRADE PYELOGRAM, URETEROSCOPY AND STENT PLACEMENT;  Surgeon: Sebastian Ache, MD;  Location: [facility];  Service: Urology;  Laterality: Right;   HOLMIUM LASER APPLICATION Right 06/15/2013   Procedure: HOLMIUM LASER APPLICATION;  Surgeon: Sebastian Ache, MD;  Location: [facility];  Service: Urology;  Laterality: Right;   ORIF LEFT WRIST FX  2013   ORIF PATELLA Left 05/27/2013   Procedure: OPEN REDUCTION INTERNAL (ORIF) FIXATION PATELLA;  Surgeon: Mable Paris, MD;  Location: [facility];  Service: Orthopedics;  Laterality: Left;  Open  reduction internal fixation left patella fracture   VAGINAL HYSTERECTOMY N/A 09/07/2012   Procedure: HYSTERECTOMY VAGINAL;  Surgeon: Levi Aland, MD;  Location: [facility];  Service: Gynecology;  Laterality: N/A;    Current Medications: Current Meds  Medication Sig   amoxicillin (AMOXIL) 500 MG capsule TAKE 4 CAPSULES ONE HOUR PRIOR TO DENTAL PROCEDURE. SAVE REMAINING TABLETS FOR FUTURE APPOINTMENTS   atorvastatin (LIPITOR) 10 MG tablet Take 10 mg by mouth daily.   Calcium 200 MG TABS calcium   clobetasol ointment (TEMOVATE) 0.05 % APPLY A THIN LAYER TO THE VULVA EVERY NIGHT X 2 WEEKS THEN ONCE WEEKLY   desvenlafaxine (PRISTIQ) 50 MG 24 hr tablet Take 50 mg by mouth every morning.    Multiple Vitamin (MULTIVITAMIN WITH MINERALS) TABS Take 1 tablet by mouth daily.   Sodium Fluoride 1.1 % PSTE APPLY PEA-SIZED AMOUNT TO TOOTHBRUSH, BRUSH FOR AT LEAST 2MIN,SPIT. USE DAILY IN PLACE OF TOOTHPASTE   traZODone (DESYREL) 100 MG tablet TAKE 1/2 TO 1 TABLET AT BEDTIME AS NEEDED   zolpidem (AMBIEN) 10 MG tablet Take 5-10 mg by mouth at bedtime as needed for sleep.      Allergies:   Hydrocodone-acetaminophen and Tramadol hcl   Social History   Socioeconomic History   Marital status: Married    Spouse name: Not on file   Number of children: Not on file   Years of education: Not on file   Highest education level: Not on file  Occupational  History   Not on file  Tobacco Use   Smoking status: Never   Smokeless tobacco: Not on file  Substance and Sexual Activity   Alcohol use: No   Drug use: No   Sexual activity: Not on file  Other Topics Concern   Not on file  Social History Narrative   Not on file   Social Determinants of Health   Financial Resource Strain: Not on file  Food Insecurity: Not on file  Transportation Needs: Not on file  Physical Activity: Not on file  Stress: Not on file  Social Connections: Not on file     Family History: The patient's ***family history includes  Cancer in an other family member.  ROS:   Please see the history of present illness.    *** All other systems reviewed and are negative.  EKGs/Labs/Other Studies Reviewed:    The following studies were reviewed today: Ca Score 01/2022: EXAM: Coronary Calcium Score   TECHNIQUE: A gated, non-contrast computed tomography scan of the heart was performed using 42mm slice thickness. Axial images were analyzed on a dedicated workstation. Calcium scoring of the coronary arteries was performed using the Agatston method.   FINDINGS: Coronary arteries: Normal origins.   Coronary Calcium Score:   Left main: 0   Left anterior descending artery: 58.6   Left circumflex artery: 101   Right coronary artery: 274   Total: 434   Percentile: 93rd   Pericardium: Normal.   Ascending Aorta: Normal caliber.   Non-cardiac: See separate report from [facility].   IMPRESSION: Coronary calcium score of 434. This was 93rd percentile for age-, race-, and sex-matched controls.   Recommend aggressive risk factor modification including LDL goal <70 and aspirin.  EKG:  EKG is *** ordered today.  The ekg ordered today demonstrates ***  Recent Labs: No results found for requested labs within last 365 days.  Recent Lipid Panel No results found for: "CHOL", "TRIG", "HDL", "CHOLHDL", "VLDL", "LDLCALC", "LDLDIRECT"   Risk Assessment/Calculations:   {Does this patient have ATRIAL FIBRILLATION?:[phone]}            Physical Exam:    VS:  BP 100/76   Pulse 68   Ht 5\' 3"  (1.6 m)   Wt 128 lb 9.6 oz (58.3 kg)   SpO2 94%   BMI 22.78 kg/m     Wt Readings from Last 3 Encounters:  03/07/22 128 lb 9.6 oz (58.3 kg)  06/15/13 135 lb (61.2 kg)  05/24/13 140 lb (63.5 kg)     GEN: *** Well nourished, well developed in no acute distress HEENT: Normal NECK: No JVD; No carotid bruits LYMPHATICS: No lymphadenopathy CARDIAC: ***RRR, no murmurs, rubs, gallops RESPIRATORY:  Clear to  auscultation without rales, wheezing or rhonchi  ABDOMEN: Soft, non-tender, non-distended MUSCULOSKELETAL:  No edema; No deformity  SKIN: Warm and dry NEUROLOGIC:  Alert and oriented x 3 PSYCHIATRIC:  Normal affect   ASSESSMENT:    No diagnosis found. PLAN:    In order of problems listed above:  #CAD: Patient with Ca score 434 which 93% for age, gender, race matched controls. Currently, *** -Continue crestor ***   #HLD: #Aortic Atherosclerosis: -Continue crestor *** -Goal LDL<70      {Are you ordering a CV Procedure (e.g. stress test, cath, DCCV, TEE, etc)?   Press F2        :07/22/13    Medication Adjustments/Labs and Tests Ordered: Current medicines are reviewed at length with the patient today.  Concerns regarding medicines  are outlined above.  No orders of the defined types were placed in this encounter.  No orders of the defined types were placed in this encounter.   There are no Patient Instructions on file for this visit.   Signed, Meriam Sprague, MD  03/07/2022 10:24 AM    [facility]

## 2022-03-07 ENCOUNTER — Ambulatory Visit: Payer: Medicare PPO | Attending: Cardiology | Admitting: Cardiology

## 2022-03-07 VITALS — BP 100/76 | HR 68 | Ht 63.0 in | Wt 128.6 lb

## 2022-03-07 DIAGNOSIS — Z79899 Other long term (current) drug therapy: Secondary | ICD-10-CM | POA: Diagnosis not present

## 2022-03-07 DIAGNOSIS — I700 Atherosclerosis of aorta: Secondary | ICD-10-CM | POA: Diagnosis not present

## 2022-03-07 DIAGNOSIS — E785 Hyperlipidemia, unspecified: Secondary | ICD-10-CM

## 2022-03-07 DIAGNOSIS — I2510 Atherosclerotic heart disease of native coronary artery without angina pectoris: Secondary | ICD-10-CM | POA: Diagnosis not present

## 2022-03-07 NOTE — Progress Notes (Signed)
Cardiology Office Note:    Date:  03/07/2022   ID:  Mindy Schmitt, DOB [birth year], MRN [id number]  PCP:  Darrow Bussing, MD   Mindy Schmitt:  None {   Referring MD: Darrow Bussing, MD    History of Present Illness:    Mindy Schmitt is a 66 y.o. female with a hx of HLD who was referred by Dr. Docia Schmitt for further evaluation of elevated Ca score.   Patient was seen by Dr. Docia Schmitt on 02/12/22. Note reviewed. Underwent Ca score for screening which showed a score of 434 (93%) now prompting referral to Cardiology.  Today, she has been feeling good overall but has been feeling anxious about her family history of heart disease. Her father had a heart attack in his late sixties and her mother had a heart attack when she was in her seventies.   She otherwise feels well from a CV standpoint. She does exercise regularly without anginal symptoms. She denies any chest pain or peripheral edema. No lightheadedness, headaches, syncope, orthopnea, or PND.   She is taking rosuvastatin 10 mg for her cholesterol. She started it 2-3 weeks ago and has been tolerating the medication well. Her previous statin she had an allergic reaction to with some skin welts on her back.    Past Medical History:  Diagnosis Date   Frequency of urination    Headache(784.0)    Left patella fracture    s/p orif  05-27-2013   Nausea    Nocturia    Right ureteral stone    Urgency of urination     Past Surgical History:  Procedure Laterality Date   ANTERIOR AND POSTERIOR REPAIR N/A 09/07/2012   Procedure: ANTERIOR (CYSTOCELE) AND POSTERIOR REPAIR (RECTOCELE);  Surgeon: Levi Aland, MD;  Location: [facility];  Service: Gynecology;  Laterality: N/A;   BLADDER SUSPENSION N/A 09/07/2012   Procedure: TRANSVAGINAL TAPE (TVT) PROCEDURE with CYSTO;  Surgeon: Levi Aland, MD;  Location: [facility];  Service: Gynecology;  Laterality: N/A;   CYSTOSCOPY WITH RETROGRADE PYELOGRAM, URETEROSCOPY  AND STENT PLACEMENT Right 06/15/2013   Procedure: CYSTOSCOPY WITH RETROGRADE PYELOGRAM, URETEROSCOPY AND STENT PLACEMENT;  Surgeon: Sebastian Ache, MD;  Location: [facility];  Service: Urology;  Laterality: Right;   HOLMIUM LASER APPLICATION Right 06/15/2013   Procedure: HOLMIUM LASER APPLICATION;  Surgeon: Sebastian Ache, MD;  Location: [facility];  Service: Urology;  Laterality: Right;   ORIF LEFT WRIST FX  2013   ORIF PATELLA Left 05/27/2013   Procedure: OPEN REDUCTION INTERNAL (ORIF) FIXATION PATELLA;  Surgeon: Mable Paris, MD;  Location: [facility];  Service: Orthopedics;  Laterality: Left;  Open reduction internal fixation left patella fracture   VAGINAL HYSTERECTOMY N/A 09/07/2012   Procedure: HYSTERECTOMY VAGINAL;  Surgeon: Levi Aland, MD;  Location: [facility];  Service: Gynecology;  Laterality: N/A;    Current Medications: Current Meds  Medication Sig   amoxicillin (AMOXIL) 500 MG capsule TAKE 4 CAPSULES ONE HOUR PRIOR TO DENTAL PROCEDURE. SAVE REMAINING TABLETS FOR FUTURE APPOINTMENTS   atorvastatin (LIPITOR) 10 MG tablet Take 10 mg by mouth daily.   Calcium 200 MG TABS calcium   clobetasol ointment (TEMOVATE) 0.05 % APPLY A THIN LAYER TO THE VULVA EVERY NIGHT X 2 WEEKS THEN ONCE WEEKLY   desvenlafaxine (PRISTIQ) 50 MG 24 hr tablet Take 50 mg by mouth every morning.    Multiple Vitamin (MULTIVITAMIN WITH MINERALS) TABS Take 1 tablet by mouth daily.  Sodium Fluoride 1.1 % PSTE APPLY PEA-SIZED AMOUNT TO TOOTHBRUSH, BRUSH FOR AT LEAST 2MIN,SPIT. USE DAILY IN PLACE OF TOOTHPASTE   traZODone (DESYREL) 100 MG tablet TAKE 1/2 TO 1 TABLET AT BEDTIME AS NEEDED   zolpidem (AMBIEN) 10 MG tablet Take 5-10 mg by mouth at bedtime as needed for sleep.      Allergies:   Hydrocodone-acetaminophen and Tramadol hcl   Social History   Socioeconomic History   Marital status: Married    Spouse name: Not on file   Number of children: Not on file   Years of  education: Not on file   Highest education level: Not on file  Occupational History   Not on file  Tobacco Use   Smoking status: Never   Smokeless tobacco: Not on file  Substance and Sexual Activity   Alcohol use: No   Drug use: No   Sexual activity: Not on file  Other Topics Concern   Not on file  Social History Narrative   Not on file   Social Determinants of Health   Financial Resource Strain: Not on file  Food Insecurity: Not on file  Transportation Needs: Not on file  Physical Activity: Not on file  Stress: Not on file  Social Connections: Not on file     Family History: The patient's family history includes Cancer in an other family member.  ROS:   Review of Systems  Constitutional:  Negative for chills and fever.  HENT:  Negative for hearing loss and tinnitus.   Eyes:  Negative for blurred vision and double vision.  Respiratory:  Positive for shortness of breath. Negative for cough and hemoptysis.   Cardiovascular:  Positive for palpitations. Negative for chest pain, orthopnea, claudication, leg swelling and PND.  Gastrointestinal:  Negative for abdominal pain and vomiting.  Genitourinary:  Negative for dysuria and urgency.  Musculoskeletal:  Negative for myalgias.  Skin:  Negative for itching and rash.  Neurological:  Negative for dizziness and headaches.  Endo/Heme/Allergies:  Does not bruise/bleed easily.  Psychiatric/Behavioral:  Negative for depression and suicidal ideas.      EKGs/Labs/Other Studies Reviewed:    The following studies were reviewed today: Ca Score 01/2022: EXAM: Coronary Calcium Score   TECHNIQUE: A gated, non-contrast computed tomography scan of the heart was performed using 68mm slice thickness. Axial images were analyzed on a dedicated workstation. Calcium scoring of the coronary arteries was performed using the Agatston method.   FINDINGS: Coronary arteries: Normal origins.   Coronary Calcium Score:   Left main: 0   Left  anterior descending artery: 58.6   Left circumflex artery: 101   Right coronary artery: 274   Total: 434   Percentile: 93rd   Pericardium: Normal.   Ascending Aorta: Normal caliber.   Non-cardiac: See separate report from [facility].   IMPRESSION: Coronary calcium score of 434. This was 93rd percentile for age-, race-, and sex-matched controls.   Recommend aggressive risk factor modification including LDL goal <70 and aspirin.  EKG:  EKG is personally reviewed.  03/07/2022: Normal sinus rhythm, HR 68 bpm.   Recent Labs: No results found for requested labs within last 365 days.  Recent Lipid Panel No results found for: "CHOL", "TRIG", "HDL", "CHOLHDL", "VLDL", "LDLCALC", "LDLDIRECT"   Risk Assessment/Calculations:                Physical Exam:    VS:  BP 100/76   Pulse 68   Ht 5\' 3"  (1.6 m)   Wt 128  lb 9.6 oz (58.3 kg)   SpO2 94%   BMI 22.78 kg/m     Wt Readings from Last 3 Encounters:  03/07/22 128 lb 9.6 oz (58.3 kg)  06/15/13 135 lb (61.2 kg)  05/24/13 140 lb (63.5 kg)     GEN:  Well nourished, well developed in no acute distress HEENT: Normal NECK: No JVD; No carotid bruits CARDIAC: RRR, no murmurs, rubs, gallops RESPIRATORY:  Clear to auscultation without rales, wheezing or rhonchi  ABDOMEN: Soft, non-tender, non-distended MUSCULOSKELETAL:  No edema; No deformity  SKIN: Warm and dry NEUROLOGIC:  Alert and oriented x 3 PSYCHIATRIC:  Normal affect   ASSESSMENT:    1. Coronary artery disease involving native heart without angina pectoris, unspecified vessel or lesion type   2. Medication management   3. Hyperlipidemia, unspecified hyperlipidemia type   4. Aortic atherosclerosis (HCC)    PLAN:    In order of problems listed above:  #CAD: Patient with Ca score 434 which 93% for age, gender, race matched controls. Currently, she feels well and is active without anginal symptoms. Will continue with aggressive medical management and  risk factor modificaitons. -Continue crestor 10mg  daily -Check NMR panel, Lp(a), and apolipoprotein B in 6 weeks  #HLD: #Aortic Atherosclerosis: -Continue crestor 10mg  daily -Goal LDL<70 -Check NMR panel, Lp(a), and apolipoprotein B in 6 weeks as detailed above          Follow Up: 1 year Medication Adjustments/Labs and Tests Ordered: Current medicines are reviewed at length with the patient today.  Concerns regarding medicines are outlined above.  Orders Placed This Encounter  Procedures   NMR, lipoprofile   Lipoprotein A (LPA)   Apolipoprotein B   No orders of the defined types were placed in this encounter.   Patient Instructions  Medication Instructions:   Your physician recommends that you continue on your current medications as directed. Please refer to the Current Medication list given to you today.  *If you need a refill on your cardiac medications before your next appointment, please call your pharmacy*   Lab Work:  IN [place] OFFICE--CHECK NMR LIPOPROFILE, LIPOPROTEIN A, AND APOLIPOPROTEIN B--PLEASE COME FASTING  If you have labs (blood work) drawn today and your tests are completely normal, you will receive your results only by: MyChart Message (if you have MyChart) OR A paper copy in the mail If you have any lab test that is abnormal or we need to change your treatment, we will call you to review the results.    Follow-Up: At [facility], you and your health needs are our priority.  As part of our continuing mission to provide you with exceptional heart care, we have created designated Provider Care Teams.  These Care Teams include your primary Schmitt (physician) and Advanced Practice Schmitt (APPs -  Physician Assistants and Nurse Practitioners) who all work together to provide you with the care you need, when you need it.  We recommend signing up for the patient portal called "MyChart".  Sign up information is provided on this  After Visit Summary.  MyChart is used to connect with patients for Virtual Visits (Telemedicine).  Patients are able to view lab/test results, encounter notes, upcoming appointments, etc.  Non-urgent messages can be sent to your provider as well.   To learn more about what you can do with MyChart, go to [email or web address].    Your next appointment:   1 year(s)  The format for your next appointment:   In  Person  Provider:   DR. Johney Frame   Important Information About Sugar          Burnett Kanaris Ford,acting as a scribe for Freada Bergeron, MD.,have documented all relevant documentation on the behalf of Freada Bergeron, MD,as directed by  Freada Bergeron, MD while in the presence of Freada Bergeron, MD.   I, Freada Bergeron, MD, have reviewed all documentation for this visit. The documentation on 03/07/22 for the exam, diagnosis, procedures, and orders are all accurate and complete.   Signed, Freada Bergeron, MD  03/07/2022 11:12 AM    [place]

## 2022-03-07 NOTE — Patient Instructions (Signed)
Medication Instructions:   Your physician recommends that you continue on your current medications as directed. Please refer to the Current Medication list given to you today.  *If you need a refill on your cardiac medications before your next appointment, please call your pharmacy*   Lab Work:  IN 6 WEEKS HERE IN THE OFFICE--CHECK NMR LIPOPROFILE, LIPOPROTEIN A, AND APOLIPOPROTEIN B--PLEASE COME FASTING  If you have labs (blood work) drawn today and your tests are completely normal, you will receive your results only by: MyChart Message (if you have MyChart) OR A paper copy in the mail If you have any lab test that is abnormal or we need to change your treatment, we will call you to review the results.    Follow-Up: At [facility], you and your health needs are our priority.  As part of our continuing mission to provide you with exceptional heart care, we have created designated Provider Care Teams.  These Care Teams include your primary Cardiologist (physician) and Advanced Practice Providers (APPs -  Physician Assistants and Nurse Practitioners) who all work together to provide you with the care you need, when you need it.  We recommend signing up for the patient portal called "MyChart".  Sign up information is provided on this After Visit Summary.  MyChart is used to connect with patients for Virtual Visits (Telemedicine).  Patients are able to view lab/test results, encounter notes, upcoming appointments, etc.  Non-urgent messages can be sent to your provider as well.   To learn more about what you can do with MyChart, go to [email or web address].    Your next appointment:   1 year(s)  The format for your next appointment:   In Person  Provider:   DR. Shari Prows   Important Information About Sugar

## 2022-03-20 DIAGNOSIS — J019 Acute sinusitis, unspecified: Secondary | ICD-10-CM | POA: Diagnosis not present

## 2022-04-18 ENCOUNTER — Ambulatory Visit: Payer: Medicare PPO | Attending: Cardiology

## 2022-04-18 DIAGNOSIS — Z79899 Other long term (current) drug therapy: Secondary | ICD-10-CM

## 2022-04-18 DIAGNOSIS — I2510 Atherosclerotic heart disease of native coronary artery without angina pectoris: Secondary | ICD-10-CM | POA: Diagnosis not present

## 2022-04-18 DIAGNOSIS — E785 Hyperlipidemia, unspecified: Secondary | ICD-10-CM | POA: Diagnosis not present

## 2022-04-19 LAB — NMR, LIPOPROFILE
Cholesterol, Total: 190 mg/dL (ref 100–199)
HDL Particle Number: 47.7 umol/L (ref >=30.5)
HDL-C: 92 mg/dL (ref >39)
LDL Particle Number: 805 nmol/L (ref <1000)
LDL Size: 21.1 nm (ref >20.5)
LDL-C (NIH Calc): 85 mg/dL (ref 0–99)
LP-IR Score: <25 (ref <=45)
Small LDL Particle Number: <90 nmol/L (ref <=527)
Triglycerides: 68 mg/dL (ref 0–149)

## 2022-04-19 LAB — APOLIPOPROTEIN B: Apolipoprotein B: 74 mg/dL (ref <90)

## 2022-04-19 LAB — LIPOPROTEIN A (LPA): Lipoprotein (a): 48.2 nmol/L (ref <75.0)

## 2022-04-23 ENCOUNTER — Telehealth: Payer: Self-pay | Admitting: *Deleted

## 2022-04-23 DIAGNOSIS — Z79899 Other long term (current) drug therapy: Secondary | ICD-10-CM

## 2022-04-23 DIAGNOSIS — E785 Hyperlipidemia, unspecified: Secondary | ICD-10-CM

## 2022-04-23 DIAGNOSIS — I2510 Atherosclerotic heart disease of native coronary artery without angina pectoris: Secondary | ICD-10-CM

## 2022-04-23 DIAGNOSIS — I700 Atherosclerosis of aorta: Secondary | ICD-10-CM

## 2022-04-23 MED ORDER — ROSUVASTATIN CALCIUM 20 MG PO TABS: 20.0000 mg | ORAL_TABLET | Freq: Every day | ORAL | 2 refills | Status: DC

## 2022-04-23 NOTE — Telephone Encounter (Signed)
-----   Message from Freada Bergeron, MD sent at 04/23/2022  1:14 PM EST ----- Her LDL cholesterol is above goal at 85. Goal <70. Can we increase her crestor to 20mg  daily and repeat a cholesterol panel in 8 weeks.   Her other numbers look excellent!

## 2022-04-23 NOTE — Telephone Encounter (Signed)
The patient has been notified of the result and verbalized understanding.  All questions (if any) were answered.  Pt aware that we will stop her atorvastatin and switch her to crestor 20 mg po daily and have her come back in for repeat lipids in 8 weeks.   Confirmed the pharmacy of choice with the pt.    Scheduled the pt for repeat lipids in 8 weeks on 06/18/22.  She is aware to come fasting to this lab appt.   Pt verbalized understanding and agrees with this plan.

## 2022-06-03 DIAGNOSIS — U071 COVID-19: Secondary | ICD-10-CM | POA: Diagnosis not present

## 2022-06-03 DIAGNOSIS — R051 Acute cough: Secondary | ICD-10-CM | POA: Diagnosis not present

## 2022-06-18 ENCOUNTER — Ambulatory Visit: Payer: Medicare PPO | Attending: Cardiology

## 2022-06-18 DIAGNOSIS — I2510 Atherosclerotic heart disease of native coronary artery without angina pectoris: Secondary | ICD-10-CM

## 2022-06-18 DIAGNOSIS — E785 Hyperlipidemia, unspecified: Secondary | ICD-10-CM

## 2022-06-18 DIAGNOSIS — I700 Atherosclerosis of aorta: Secondary | ICD-10-CM | POA: Diagnosis not present

## 2022-06-18 DIAGNOSIS — Z79899 Other long term (current) drug therapy: Secondary | ICD-10-CM

## 2022-06-19 LAB — LIPID PANEL
Chol/HDL Ratio: 2 ratio (ref 0.0–4.4)
Cholesterol, Total: 163 mg/dL (ref 100–199)
HDL: 82 mg/dL (ref >39)
LDL Chol Calc (NIH): 70 mg/dL (ref 0–99)
Triglycerides: 51 mg/dL (ref 0–149)
VLDL Cholesterol Cal: 11 mg/dL (ref 5–40)

## 2022-06-20 DIAGNOSIS — D225 Melanocytic nevi of trunk: Secondary | ICD-10-CM | POA: Diagnosis not present

## 2022-06-20 DIAGNOSIS — L814 Other melanin hyperpigmentation: Secondary | ICD-10-CM | POA: Diagnosis not present

## 2022-06-20 DIAGNOSIS — L821 Other seborrheic keratosis: Secondary | ICD-10-CM | POA: Diagnosis not present

## 2022-08-16 DIAGNOSIS — Z01419 Encounter for gynecological examination (general) (routine) without abnormal findings: Secondary | ICD-10-CM | POA: Diagnosis not present

## 2022-08-16 DIAGNOSIS — Z1231 Encounter for screening mammogram for malignant neoplasm of breast: Secondary | ICD-10-CM | POA: Diagnosis not present

## 2022-12-31 ENCOUNTER — Other Ambulatory Visit: Payer: Self-pay | Admitting: *Deleted

## 2022-12-31 DIAGNOSIS — I2510 Atherosclerotic heart disease of native coronary artery without angina pectoris: Secondary | ICD-10-CM

## 2022-12-31 DIAGNOSIS — I700 Atherosclerosis of aorta: Secondary | ICD-10-CM

## 2022-12-31 DIAGNOSIS — Z79899 Other long term (current) drug therapy: Secondary | ICD-10-CM

## 2022-12-31 DIAGNOSIS — E785 Hyperlipidemia, unspecified: Secondary | ICD-10-CM

## 2022-12-31 MED ORDER — ROSUVASTATIN CALCIUM 20 MG PO TABS: 20.0000 mg | ORAL_TABLET | Freq: Every day | ORAL | 0 refills | Status: AC

## 2023-01-02 DIAGNOSIS — Z0001 Encounter for general adult medical examination with abnormal findings: Secondary | ICD-10-CM | POA: Diagnosis not present

## 2023-01-02 DIAGNOSIS — F331 Major depressive disorder, recurrent, moderate: Secondary | ICD-10-CM | POA: Diagnosis not present

## 2023-01-02 DIAGNOSIS — F5101 Primary insomnia: Secondary | ICD-10-CM | POA: Diagnosis not present

## 2023-01-02 DIAGNOSIS — E7800 Pure hypercholesterolemia, unspecified: Secondary | ICD-10-CM | POA: Diagnosis not present

## 2023-01-02 DIAGNOSIS — Z79899 Other long term (current) drug therapy: Secondary | ICD-10-CM | POA: Diagnosis not present

## 2023-01-02 DIAGNOSIS — M810 Age-related osteoporosis without current pathological fracture: Secondary | ICD-10-CM | POA: Diagnosis not present

## 2023-01-02 DIAGNOSIS — I700 Atherosclerosis of aorta: Secondary | ICD-10-CM | POA: Diagnosis not present

## 2023-01-14 ENCOUNTER — Other Ambulatory Visit: Payer: Self-pay | Admitting: Obstetrics and Gynecology

## 2023-01-14 DIAGNOSIS — M810 Age-related osteoporosis without current pathological fracture: Secondary | ICD-10-CM

## 2023-04-01 DIAGNOSIS — N362 Urethral caruncle: Secondary | ICD-10-CM | POA: Diagnosis not present

## 2023-04-01 DIAGNOSIS — L900 Lichen sclerosus et atrophicus: Secondary | ICD-10-CM | POA: Diagnosis not present

## 2023-04-01 DIAGNOSIS — R300 Dysuria: Secondary | ICD-10-CM | POA: Diagnosis not present

## 2023-04-01 DIAGNOSIS — N905 Atrophy of vulva: Secondary | ICD-10-CM | POA: Diagnosis not present

## 2023-05-08 DIAGNOSIS — Z23 Encounter for immunization: Secondary | ICD-10-CM | POA: Diagnosis not present

## 2023-05-08 DIAGNOSIS — R0981 Nasal congestion: Secondary | ICD-10-CM | POA: Diagnosis not present

## 2023-05-12 DIAGNOSIS — L900 Lichen sclerosus et atrophicus: Secondary | ICD-10-CM | POA: Diagnosis not present

## 2023-05-16 DIAGNOSIS — H2513 Age-related nuclear cataract, bilateral: Secondary | ICD-10-CM | POA: Diagnosis not present

## 2023-05-19 DIAGNOSIS — L2989 Other pruritus: Secondary | ICD-10-CM | POA: Diagnosis not present

## 2023-05-19 DIAGNOSIS — L538 Other specified erythematous conditions: Secondary | ICD-10-CM | POA: Diagnosis not present

## 2023-05-19 DIAGNOSIS — Z789 Other specified health status: Secondary | ICD-10-CM | POA: Diagnosis not present

## 2023-05-19 DIAGNOSIS — L820 Inflamed seborrheic keratosis: Secondary | ICD-10-CM | POA: Diagnosis not present

## 2023-07-02 DIAGNOSIS — Z83719 Family history of colon polyps, unspecified: Secondary | ICD-10-CM | POA: Diagnosis not present

## 2023-07-02 DIAGNOSIS — Z09 Encounter for follow-up examination after completed treatment for conditions other than malignant neoplasm: Secondary | ICD-10-CM | POA: Diagnosis not present

## 2023-07-02 DIAGNOSIS — K5730 Diverticulosis of large intestine without perforation or abscess without bleeding: Secondary | ICD-10-CM | POA: Diagnosis not present

## 2023-07-02 DIAGNOSIS — Z860100 Personal history of colon polyps, unspecified: Secondary | ICD-10-CM | POA: Diagnosis not present

## 2023-08-25 DIAGNOSIS — Z1231 Encounter for screening mammogram for malignant neoplasm of breast: Secondary | ICD-10-CM | POA: Diagnosis not present

## 2024-01-20 DIAGNOSIS — E7800 Pure hypercholesterolemia, unspecified: Secondary | ICD-10-CM | POA: Diagnosis not present

## 2024-01-20 DIAGNOSIS — Z79899 Other long term (current) drug therapy: Secondary | ICD-10-CM | POA: Diagnosis not present

## 2024-01-20 DIAGNOSIS — E2839 Other primary ovarian failure: Secondary | ICD-10-CM | POA: Diagnosis not present

## 2024-01-20 DIAGNOSIS — N200 Calculus of kidney: Secondary | ICD-10-CM | POA: Diagnosis not present

## 2024-01-20 DIAGNOSIS — F5101 Primary insomnia: Secondary | ICD-10-CM | POA: Diagnosis not present

## 2024-01-20 DIAGNOSIS — Z0000 Encounter for general adult medical examination without abnormal findings: Secondary | ICD-10-CM | POA: Diagnosis not present

## 2024-01-20 DIAGNOSIS — F331 Major depressive disorder, recurrent, moderate: Secondary | ICD-10-CM | POA: Diagnosis not present

## 2024-02-10 DIAGNOSIS — R319 Hematuria, unspecified: Secondary | ICD-10-CM | POA: Diagnosis not present

## 2024-02-19 DIAGNOSIS — R319 Hematuria, unspecified: Secondary | ICD-10-CM | POA: Diagnosis not present
